# Patient Record
Sex: Female | Born: 1955 | Race: Black or African American | Hispanic: No | Marital: Married | Smoking: Never smoker
Health system: Southern US, Community
[De-identification: ages and names within clinical notes are randomized; demographics above are authoritative.]

## PROBLEM LIST (undated history)

## (undated) DIAGNOSIS — I10 Essential (primary) hypertension: Secondary | ICD-10-CM

## (undated) HISTORY — PX: ABDOMINAL HYSTERECTOMY: SHX81

---

## 2016-07-17 DIAGNOSIS — J111 Influenza due to unidentified influenza virus with other respiratory manifestations: Secondary | ICD-10-CM | POA: Insufficient documentation

## 2016-07-17 DIAGNOSIS — J0100 Acute maxillary sinusitis, unspecified: Secondary | ICD-10-CM | POA: Insufficient documentation

## 2019-08-06 ENCOUNTER — Telehealth: Payer: Self-pay | Admitting: *Deleted

## 2019-08-06 NOTE — Telephone Encounter (Signed)
Patient confirming vaccine appointment.

## 2019-08-09 ENCOUNTER — Ambulatory Visit: Payer: Self-pay | Attending: Internal Medicine

## 2019-08-09 DIAGNOSIS — Z23 Encounter for immunization: Secondary | ICD-10-CM | POA: Insufficient documentation

## 2019-08-09 NOTE — Progress Notes (Signed)
   Covid-19 Vaccination Clinic  Name:  Cristina Quinn    MRN: [id number] DOB: April 08, 1956  08/09/2019  Cristina Quinn was observed post Covid-19 immunization for 15 minutes without incident. She was provided with Vaccine Information Sheet and instruction to access the V-Safe system.   Cristina Quinn was instructed to call 911 with any severe reactions post vaccine: Marland Kitchen Difficulty breathing  . Swelling of face and throat  . A fast heartbeat  . A bad rash all over body  . Dizziness and weakness   Immunizations Administered    Name Date Dose VIS Date Route   Pfizer COVID-19 Vaccine 08/09/2019  1:30 PM 0.3 mL 05/15/2019 Intramuscular   Manufacturer: [employer], [employer]   Lot: [id number]   NDC: [address]-4

## 2019-09-08 ENCOUNTER — Ambulatory Visit: Payer: Self-pay | Attending: Internal Medicine

## 2019-09-08 DIAGNOSIS — Z23 Encounter for immunization: Secondary | ICD-10-CM

## 2019-09-08 NOTE — Progress Notes (Signed)
   Covid-19 Vaccination Clinic  Name:  Cristina Quinn    MRN: [id number] DOB: [birth year]  09/08/2019  Cristina Quinn was observed post Covid-19 immunization for 15 minutes without incident. She was provided with Vaccine Information Sheet and instruction to access the V-Safe system.   Cristina Quinn was instructed to call 911 with any severe reactions post vaccine: Marland Kitchen Difficulty breathing  . Swelling of face and throat  . A fast heartbeat  . A bad rash all over body  . Dizziness and weakness   Immunizations Administered    Name Date Dose VIS Date Route   Pfizer COVID-19 Vaccine 09/08/2019  4:00 PM 0.3 mL 05/15/2019 Intramuscular   Manufacturer: [employer], [employer]   Lot: [id number]   NDC: [address]-5

## 2019-10-26 ENCOUNTER — Emergency Department (HOSPITAL_BASED_OUTPATIENT_CLINIC_OR_DEPARTMENT_OTHER): Payer: PRIVATE HEALTH INSURANCE

## 2019-10-26 ENCOUNTER — Emergency Department (HOSPITAL_BASED_OUTPATIENT_CLINIC_OR_DEPARTMENT_OTHER)
Admission: EM | Admit: 2019-10-26 | Discharge: 2019-10-26 | Disposition: A | Discharge status: Home / Self Care | Payer: PRIVATE HEALTH INSURANCE | Attending: Emergency Medicine | Admitting: Emergency Medicine

## 2019-10-26 ENCOUNTER — Encounter (HOSPITAL_BASED_OUTPATIENT_CLINIC_OR_DEPARTMENT_OTHER): Payer: Self-pay

## 2019-10-26 ENCOUNTER — Other Ambulatory Visit: Payer: Self-pay

## 2019-10-26 DIAGNOSIS — I10 Essential (primary) hypertension: Secondary | ICD-10-CM | POA: Diagnosis not present

## 2019-10-26 DIAGNOSIS — R002 Palpitations: Secondary | ICD-10-CM | POA: Diagnosis not present

## 2019-10-26 DIAGNOSIS — Z79899 Other long term (current) drug therapy: Secondary | ICD-10-CM | POA: Insufficient documentation

## 2019-10-26 HISTORY — DX: Essential (primary) hypertension: I10

## 2019-10-26 LAB — CBC WITH DIFFERENTIAL/PLATELET
Abs Immature Granulocytes: 0.01 10*3/uL (ref 0.00–0.07)
Basophils Absolute: 0 10*3/uL (ref 0.0–0.1)
Basophils Relative: 1 %
Eosinophils Absolute: 0.1 10*3/uL (ref 0.0–0.5)
Eosinophils Relative: 1 %
HCT: 31.6 % — ABNORMAL LOW (ref 36.0–46.0)
Hemoglobin: 9.8 g/dL — ABNORMAL LOW (ref 12.0–15.0)
Immature Granulocytes: 0 %
Lymphocytes Relative: 39 %
Lymphs Abs: 2.7 10*3/uL (ref 0.7–4.0)
MCH: 24 pg — ABNORMAL LOW (ref 26.0–34.0)
MCHC: 31 g/dL (ref 30.0–36.0)
MCV: 77.3 fL — ABNORMAL LOW (ref 80.0–100.0)
Monocytes Absolute: 0.6 10*3/uL (ref 0.1–1.0)
Monocytes Relative: 9 %
Neutro Abs: 3.5 10*3/uL (ref 1.7–7.7)
Neutrophils Relative %: 50 %
Platelets: 294 10*3/uL (ref 150–400)
RBC: 4.09 MIL/uL (ref 3.87–5.11)
RDW: 15.6 % — ABNORMAL HIGH (ref 11.5–15.5)
WBC: 7 10*3/uL (ref 4.0–10.5)
nRBC: 0 % (ref 0.0–0.2)

## 2019-10-26 LAB — COMPREHENSIVE METABOLIC PANEL
ALT: 13 U/L (ref 0–44)
AST: 20 U/L (ref 15–41)
Albumin: 4 g/dL (ref 3.5–5.0)
Alkaline Phosphatase: 30 U/L — ABNORMAL LOW (ref 38–126)
Anion gap: 11 (ref 5–15)
BUN: 14 mg/dL (ref 8–23)
CO2: 25 mmol/L (ref 22–32)
Calcium: 8.5 mg/dL — ABNORMAL LOW (ref 8.9–10.3)
Chloride: 102 mmol/L (ref 98–111)
Creatinine, Ser: 0.8 mg/dL (ref 0.44–1.00)
GFR calc Af Amer: >60 mL/min (ref >60)
GFR calc non Af Amer: >60 mL/min (ref >60)
Glucose, Bld: 109 mg/dL — ABNORMAL HIGH (ref 70–99)
Potassium: 3.9 mmol/L (ref 3.5–5.1)
Sodium: 138 mmol/L (ref 135–145)
Total Bilirubin: 0.8 mg/dL (ref 0.3–1.2)
Total Protein: 7.1 g/dL (ref 6.5–8.1)

## 2019-10-26 MED ORDER — HYDROCHLOROTHIAZIDE 25 MG PO TABS: 25.0000 mg | ORAL_TABLET | Freq: Every day | ORAL | 0 refills | Status: DC

## 2019-10-26 NOTE — Discharge Instructions (Addendum)
I have refilled your prescription for HCTZ.  The pharmacy did not know that you are on a statin.  You will need to discuss with your primary care doctor to get this refilled.  As we discussed, your work-up today was reassuring.  You need to follow-up with your primary care doctor as you may need a Holter monitor at home to monitor these episodes of palpitations.  Return the emergency department for chest pain, difficulty breathing, abdominal pain, vomiting or any other worsening or concerning symptoms.

## 2019-10-26 NOTE — ED Triage Notes (Signed)
Pt states that her heart has been beating fast off and on over the past few months. Pt reports that this has become more frequent with some lightheadedness. Pt reports she has also been out of her BP medication X1 month.

## 2019-10-26 NOTE — ED Provider Notes (Signed)
[facility] EMERGENCY DEPARTMENT Provider Note   CSN: [id number] Arrival date & time: 10/26/19  1635     History Chief Complaint  Patient presents with  . Palpitations    Cristina Quinn is a 64 y.o. female has been history of hypertension who presents for evaluation of palpitations.  Patient reports that this is been an ongoing issue for the last several months.  She states that she had had episodes where it had gone away and she thought that it improved but several months ago, her return.  She states that these episodes occur randomly.  She states that she did notice them when she would get stressed or anxious.  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  She has also had on at rest.  They are not tied to a certain activity.  She does not have any associated chest pain or shortness of breath.  She feels like over the last 2 weeks, they have become more severe.  Today, she felt like it was lasting a bit longer which is what prompted her to come to the emergency department.  She reports that she is up from [place] visiting and taking care of her mother.  She states that she is on a statin and hydrochlorothiazide 25 mg daily for her blood pressure.  She reports she has been out of those for 6 weeks.  She denies any fevers, cough, abdominal pain, nausea/vomiting, chest pain.  The history is provided by the patient.       Past Medical History:  Diagnosis Date  . Hypertension     There are no problems to display for this patient.   Past Surgical History:  Procedure Laterality Date  . ABDOMINAL HYSTERECTOMY       OB History   No obstetric history on file.     No family history on file.  Social History   Tobacco Use  . Smoking status: Never Smoker  . Smokeless tobacco: Never Used  Substance Use Topics  . Alcohol  use: Yes    Comment: social  . Drug use: Never    Home Medications Prior to Admission medications   Medication Sig Start Date End Date Taking? Authorizing Provider  hydrochlorothiazide (HYDRODIURIL) 25 MG tablet Take 1 tablet (25 mg total) by mouth daily. 10/26/19 11/25/19  Volanda Napoleon, PA-C    Allergies    Patient has no known allergies.  Review of Systems   Review of Systems  Constitutional: Negative for fever.  Respiratory: Negative for cough and shortness of breath.   Cardiovascular: Positive for palpitations. Negative for chest pain.  Gastrointestinal: Negative for abdominal pain, nausea and vomiting.  Genitourinary: Negative for dysuria and hematuria.  Neurological: Negative for headaches.  All other systems reviewed and are negative.   Physical Exam Updated Vital Signs BP 137/83   Pulse (!) 54   Temp 98 F (36.7 C) (Oral)   Resp 18   Ht 5\' 4"  (1.626 m)   Wt 89.4 kg   SpO2 96%   BMI 33.81 kg/m   Physical Exam Vitals and nursing note reviewed.  Constitutional:      Appearance: Normal appearance. She is well-developed.  HENT:     Head: Normocephalic and atraumatic.  Eyes:     General: Lids are normal.     Conjunctiva/sclera: Conjunctivae  normal.     Pupils: Pupils are equal, round, and reactive to light.  Cardiovascular:     Rate and Rhythm: Normal rate and regular rhythm.     Pulses: Normal pulses.     Heart sounds: Normal heart sounds. No murmur. No friction rub. No gallop.   Pulmonary:     Effort: Pulmonary effort is normal.     Breath sounds: Normal breath sounds.     Comments: Lungs clear to auscultation bilaterally.  Symmetric chest rise.  No wheezing, rales, rhonchi. Abdominal:     Palpations: Abdomen is soft. Abdomen is not rigid.     Tenderness: There is no abdominal tenderness. There is no guarding.     Comments: Abdomen is soft, non-distended, non-tender. No rigidity, No guarding. No peritoneal signs.  Musculoskeletal:        General:  Normal range of motion.     Cervical back: Full passive range of motion without pain.  Skin:    General: Skin is warm and dry.     Capillary Refill: Capillary refill takes less than 2 seconds.  Neurological:     Mental Status: She is alert and oriented to person, place, and time.  Psychiatric:        Speech: Speech normal.      ED Results / Procedures / Treatments   Labs (all labs ordered are listed, but only abnormal results are displayed) Labs Reviewed  COMPREHENSIVE METABOLIC PANEL - Abnormal; Notable for the following components:      Result Value   Glucose, Bld 109 (*)    Calcium 8.5 (*)    Alkaline Phosphatase 30 (*)    All other components within normal limits  CBC WITH DIFFERENTIAL/PLATELET - Abnormal; Notable for the following components:   Hemoglobin 9.8 (*)    HCT 31.6 (*)    MCV 77.3 (*)    MCH 24.0 (*)    RDW 15.6 (*)    All other components within normal limits    EKG EKG Interpretation  Date/Time:  Monday Oct 26 2019 16:47:38 EDT Ventricular Rate:  56 PR Interval:    QRS Duration: 108 QT Interval:  436 QTC Calculation: 421 R Axis:   60 Text Interpretation: Sinus rhythm Ventricular premature complex No previous tracing Confirmed by Gwyneth Sprout ([id number]) on 10/26/2019 5:31:25 PM   Radiology DG Chest 2 View  Result Date: 10/26/2019 CLINICAL DATA:  Palpitations peer EXAM: CHEST - 2 VIEW COMPARISON:  None. FINDINGS: There is no evidence of acute infiltrate, pleural effusion or pneumothorax. The heart size and mediastinal contours are within normal limits. The visualized skeletal structures are unremarkable. IMPRESSION: No active cardiopulmonary disease. Electronically Signed   By: Aram Candela M.D.   On: 10/26/2019 17:35    Procedures Procedures (including critical care time)  Medications Ordered in ED Medications - No data to display  ED Course  I have reviewed the triage vital signs and the nursing notes.  Pertinent labs & imaging  results that were available during my care of the patient were reviewed by me and considered in my medical decision making (see chart for details).    MDM Rules/Calculators/A&P                      64 year old female who presents for evaluation of palpitations.  She reports this is been an ongoing issue for several months.  Today, felt like it lasted longer.  She states that these episodes occur randomly though she does feel like  it is she has noticed that whenever she gets anxious/worried after taking care of her mom with dementia.  No associated chest pain or difficulty breathing.  She reports she supposed be on a statin and hydrochlorothiazide for her blood pressure but has been off of it due to visiting [place].  On initially arrival, she is afebrile, nontoxic-appearing.  Vital signs are stable.  Her heart rate is maintained between 55-60.  Question if this is anxiety related as she does pinpoint areas of stress where she feels like the palpitations are worse.  Also plan to check labs for evaluation of electrolyte abnormalities.  At this time, no chest pain.  No concern for ACS etiology.  Do not suspect PE.  Plan to check labs, chest x-ray.  CBC shows no leukocytosis.  Hemoglobin is 9.8.  CMP shows normal BUN and creatinine.  Chest x-ray shows no evidence of acute abnormalities.  We will plan to start patient on HCTZ.  At this time, she does not remember what statin she is on.  Patient is scheduled to go back to [place] in July 2021.  I discussed with patient that if she continues to have palpitations, she may need a Holter monitor.  Instructed patient that she will need to follow-up with her primary care doctor. At this time, patient exhibits no emergent life-threatening condition that require further evaluation in ED or admission. Patient had ample opportunity for questions and discussion. All patient's questions were answered with full understanding. Strict return precautions discussed.  Patient expresses understanding and agreement to plan.   Portions of this note were generated with Scientist, clinical (histocompatibility and immunogenetics). Dictation errors may occur despite best attempts at proofreading.  Final Clinical Impression(s) / ED Diagnoses Final diagnoses:  Palpitations    Rx / DC Orders ED Discharge Orders         Ordered    hydrochlorothiazide (HYDRODIURIL) 25 MG tablet  Daily     10/26/19 1826           Rosana Hoes 10/26/19 1936    Gwyneth Sprout, MD 10/27/19 2057

## 2019-10-26 NOTE — ED Notes (Signed)
Pt on monitor 

## 2021-07-02 IMAGING — CR DG CHEST 2V
2 series · 2 of 2 positions shown · non-contrast
Comparison: None.

CLINICAL DATA: Palpitations peer

EXAM:
CHEST - 2 VIEW

[w chest pa]
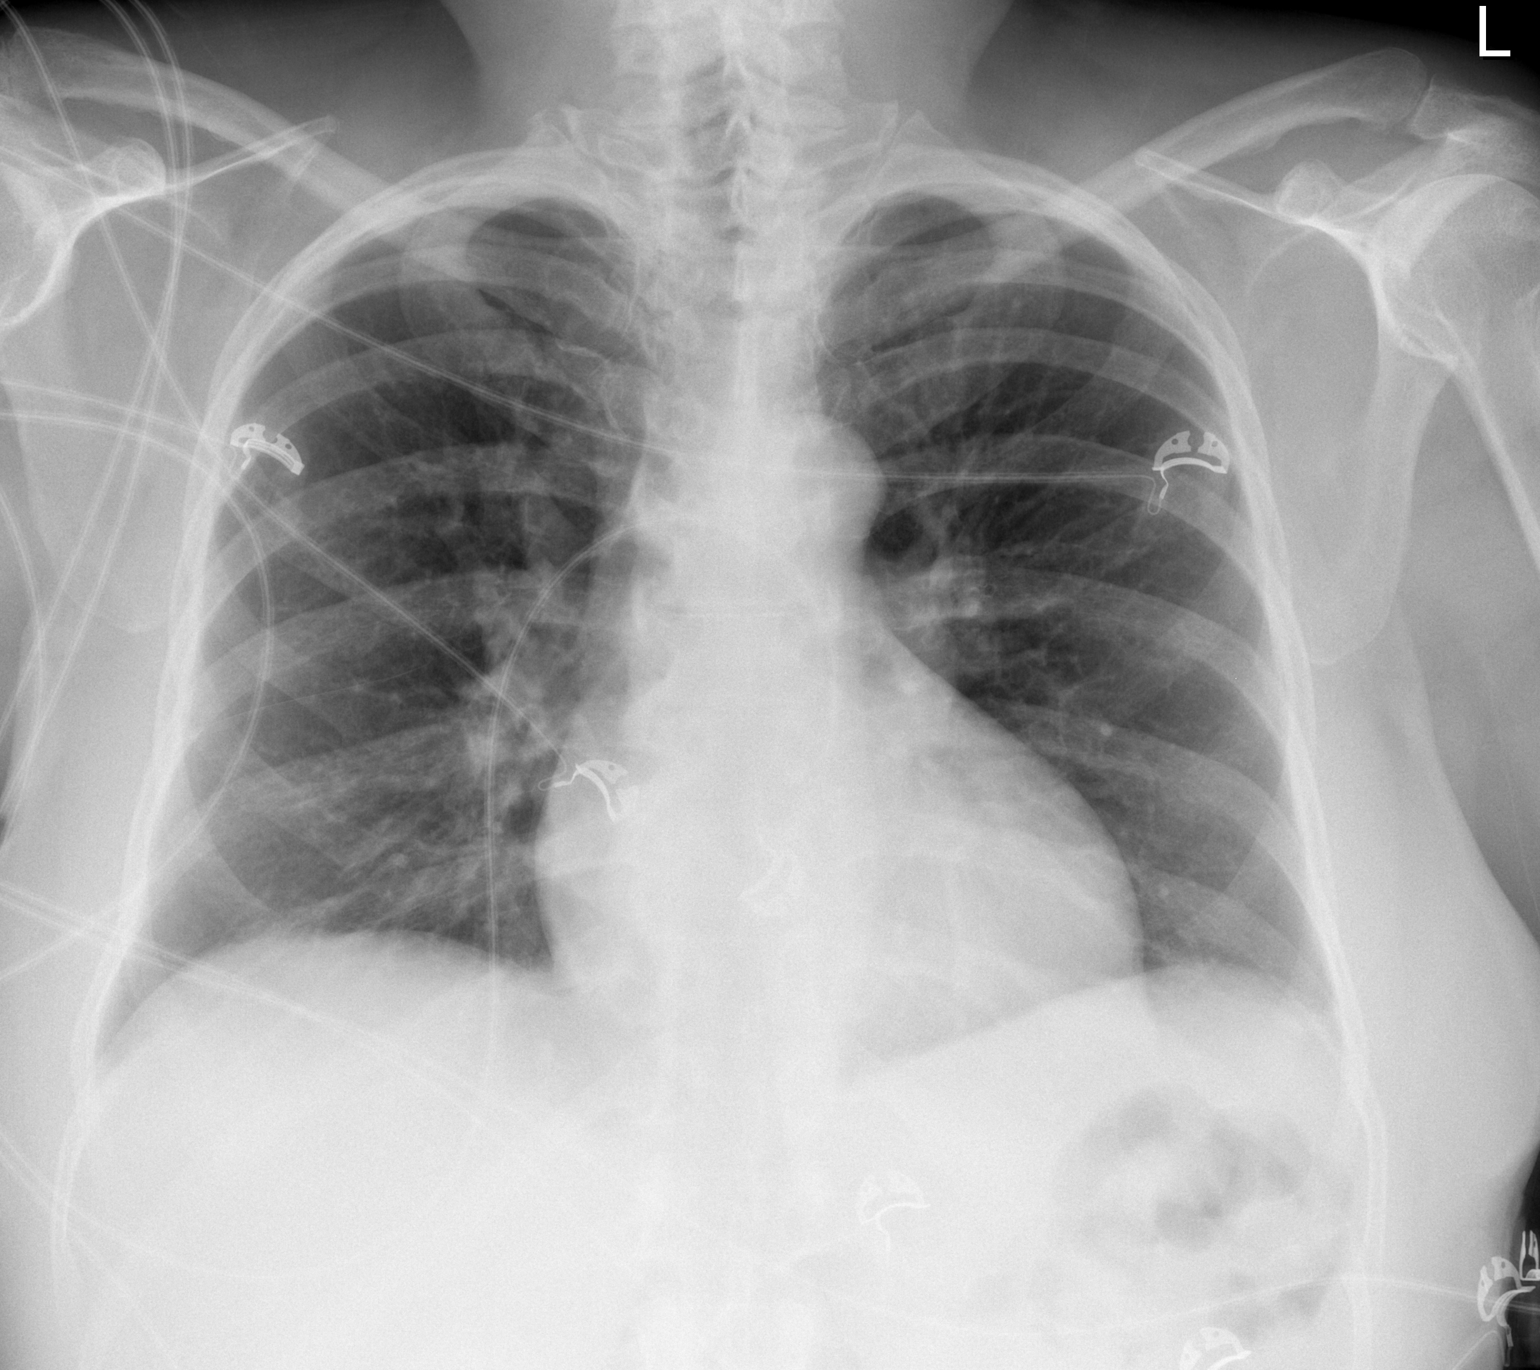

[w chest lat]
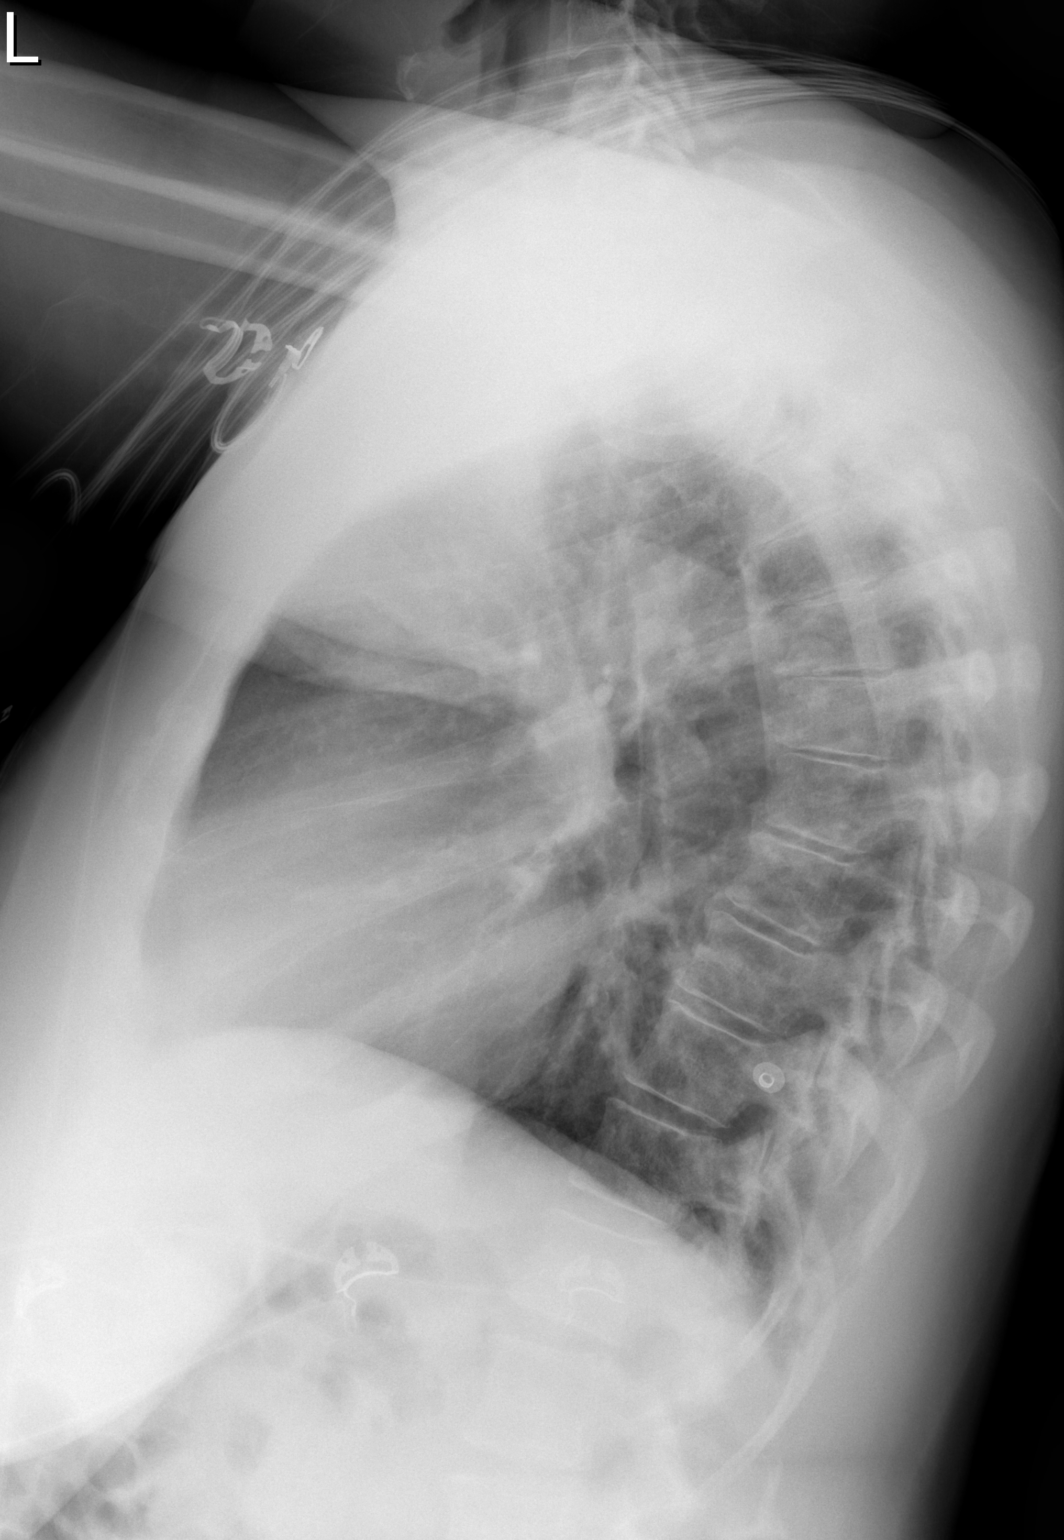

[2 of 2 positions shown; findings below may reference images not displayed]

FINDINGS: There is no evidence of acute infiltrate, pleural effusion or
pneumothorax. The heart size and mediastinal contours are within
normal limits. The visualized skeletal structures are unremarkable.
IMPRESSION: No active cardiopulmonary disease.

## 2021-07-05 DIAGNOSIS — G8929 Other chronic pain: Secondary | ICD-10-CM | POA: Insufficient documentation

## 2021-07-05 DIAGNOSIS — L659 Nonscarring hair loss, unspecified: Secondary | ICD-10-CM | POA: Insufficient documentation

## 2021-07-05 DIAGNOSIS — J302 Other seasonal allergic rhinitis: Secondary | ICD-10-CM | POA: Insufficient documentation

## 2021-07-05 DIAGNOSIS — Z636 Dependent relative needing care at home: Secondary | ICD-10-CM | POA: Insufficient documentation

## 2021-07-05 DIAGNOSIS — R413 Other amnesia: Secondary | ICD-10-CM | POA: Insufficient documentation

## 2021-07-05 DIAGNOSIS — D563 Thalassemia minor: Secondary | ICD-10-CM | POA: Insufficient documentation

## 2021-07-05 DIAGNOSIS — Z8262 Family history of osteoporosis: Secondary | ICD-10-CM | POA: Insufficient documentation

## 2021-07-05 DIAGNOSIS — F419 Anxiety disorder, unspecified: Secondary | ICD-10-CM | POA: Insufficient documentation

## 2021-12-17 DIAGNOSIS — R42 Dizziness and giddiness: Secondary | ICD-10-CM | POA: Insufficient documentation

## 2021-12-17 DIAGNOSIS — R252 Cramp and spasm: Secondary | ICD-10-CM | POA: Insufficient documentation

## 2021-12-17 DIAGNOSIS — Z8639 Personal history of other endocrine, nutritional and metabolic disease: Secondary | ICD-10-CM | POA: Insufficient documentation

## 2022-03-09 ENCOUNTER — Other Ambulatory Visit: Payer: Self-pay | Admitting: Family Medicine

## 2022-03-09 DIAGNOSIS — Z1231 Encounter for screening mammogram for malignant neoplasm of breast: Secondary | ICD-10-CM

## 2022-03-12 ENCOUNTER — Ambulatory Visit
Admission: RE | Admit: 2022-03-12 | Discharge: 2022-03-12 | Disposition: A | Discharge status: Home / Self Care | Payer: Medicare Other | Source: Ambulatory Visit | Attending: Family Medicine | Admitting: Family Medicine

## 2022-03-12 DIAGNOSIS — Z1231 Encounter for screening mammogram for malignant neoplasm of breast: Secondary | ICD-10-CM

## 2022-06-19 DIAGNOSIS — J329 Chronic sinusitis, unspecified: Secondary | ICD-10-CM | POA: Insufficient documentation

## 2023-09-19 DIAGNOSIS — H903 Sensorineural hearing loss, bilateral: Secondary | ICD-10-CM | POA: Insufficient documentation

## 2023-11-19 ENCOUNTER — Other Ambulatory Visit: Payer: Self-pay

## 2023-11-19 ENCOUNTER — Emergency Department (HOSPITAL_BASED_OUTPATIENT_CLINIC_OR_DEPARTMENT_OTHER)

## 2023-11-19 ENCOUNTER — Emergency Department (HOSPITAL_BASED_OUTPATIENT_CLINIC_OR_DEPARTMENT_OTHER): Admission: EM | Admit: 2023-11-19 | Discharge: 2023-11-19 | Disposition: A | Discharge status: Home / Self Care

## 2023-11-19 DIAGNOSIS — I10 Essential (primary) hypertension: Secondary | ICD-10-CM | POA: Insufficient documentation

## 2023-11-19 DIAGNOSIS — Z79899 Other long term (current) drug therapy: Secondary | ICD-10-CM | POA: Insufficient documentation

## 2023-11-19 DIAGNOSIS — D649 Anemia, unspecified: Secondary | ICD-10-CM | POA: Diagnosis not present

## 2023-11-19 DIAGNOSIS — R42 Dizziness and giddiness: Secondary | ICD-10-CM

## 2023-11-19 LAB — CBC WITH DIFFERENTIAL/PLATELET
Abs Immature Granulocytes: 0.02 10*3/uL (ref 0.00–0.07)
Basophils Absolute: 0 10*3/uL (ref 0.0–0.1)
Basophils Relative: 0 %
Eosinophils Absolute: 0.1 10*3/uL (ref 0.0–0.5)
Eosinophils Relative: 1 %
HCT: 32.8 % — ABNORMAL LOW (ref 36.0–46.0)
Hemoglobin: 10.3 g/dL — ABNORMAL LOW (ref 12.0–15.0)
Immature Granulocytes: 0 %
Lymphocytes Relative: 34 %
Lymphs Abs: 2.3 10*3/uL (ref 0.7–4.0)
MCH: 23.7 pg — ABNORMAL LOW (ref 26.0–34.0)
MCHC: 31.4 g/dL (ref 30.0–36.0)
MCV: 75.6 fL — ABNORMAL LOW (ref 80.0–100.0)
Monocytes Absolute: 0.6 10*3/uL (ref 0.1–1.0)
Monocytes Relative: 8 %
Neutro Abs: 3.8 10*3/uL (ref 1.7–7.7)
Neutrophils Relative %: 57 %
Platelets: 304 10*3/uL (ref 150–400)
RBC: 4.34 MIL/uL (ref 3.87–5.11)
RDW: 15.1 % (ref 11.5–15.5)
WBC: 6.7 10*3/uL (ref 4.0–10.5)
nRBC: 0 % (ref 0.0–0.2)

## 2023-11-19 LAB — COMPREHENSIVE METABOLIC PANEL WITH GFR
ALT: 14 U/L (ref 0–44)
AST: 20 U/L (ref 15–41)
Albumin: 4.3 g/dL (ref 3.5–5.0)
Alkaline Phosphatase: 39 U/L (ref 38–126)
Anion gap: 13 (ref 5–15)
BUN: 16 mg/dL (ref 8–23)
CO2: 24 mmol/L (ref 22–32)
Calcium: 9.3 mg/dL (ref 8.9–10.3)
Chloride: 107 mmol/L (ref 98–111)
Creatinine, Ser: 0.84 mg/dL (ref 0.44–1.00)
GFR, Estimated: >60 mL/min (ref >60)
Glucose, Bld: 124 mg/dL — ABNORMAL HIGH (ref 70–99)
Potassium: 3.6 mmol/L (ref 3.5–5.1)
Sodium: 143 mmol/L (ref 135–145)
Total Bilirubin: 0.6 mg/dL (ref 0.0–1.2)
Total Protein: 7.2 g/dL (ref 6.5–8.1)

## 2023-11-19 LAB — TROPONIN T, HIGH SENSITIVITY: Troponin T High Sensitivity: <15 ng/L (ref <19)

## 2023-11-19 LAB — TSH: TSH: 1.211 u[IU]/mL (ref 0.350–4.500)

## 2023-11-19 NOTE — ED Triage Notes (Signed)
 Pt states that she has had some ringing in her ears, headache and episodes of teeth pain. States that she has seen the ENT recently and a cardiologist. States that she has also had some pressure in her nose. Denies SOB, nausea and vomiting. States that the headaches come and go. States that she has some weakness everyday and some dizziness.

## 2023-11-19 NOTE — ED Provider Notes (Addendum)
 [facility] Provider Note   CSN: [id number] Arrival date & time: 11/19/23  1440     Patient presents with: Tinnitus   Cristina Quinn is a 68 y.o. female with history of hypertension, hypercholesterolemia, presents with concern for lightheadedness that has been ongoing for the past 6 weeks.  States she normally feels fine in the morning, but the lightheaded sensation will start to develop later in the day.  This is usually associated with activity or standing up.  She denies any double vision, room spinning, chest pain, shortness of breath during these episodes.  She is never passed out during these episodes.  She also reports ear ringing in both ears ongoing for the past 3 months which she has seen ENT for.  No ear drainage or pain in her ears.  No new change to her tinnitus.  Also reports intermittent headaches over the past couple weeks that feeling a pressure in sensation behind her nose.  She states she has had headaches previously, but they usually do not involve a pressure sensation behind the nose.  No headache currently. No fevers, chills, neck pain.   HPI     Prior to Admission medications   Medication Sig Start Date End Date Taking? Authorizing Provider  hydrochlorothiazide  (HYDRODIURIL ) 25 MG tablet Take 1 tablet (25 mg total) by mouth daily. 10/26/19 11/25/19  Layden, Lindsey A, PA-C    Allergies: Patient has no known allergies.    Review of Systems  Neurological:  Positive for light-headedness and headaches.    Updated Vital Signs BP 133/74 (BP Location: Left Arm)   Pulse 68   Temp 97.6 F (36.4 C)   Resp 20   Ht 5' 4 (1.626 m)   Wt 86.2 kg   SpO2 98%   BMI 32.61 kg/m   Physical Exam Vitals and nursing note reviewed.  Constitutional:      General: She is not in acute distress.    Appearance: She is well-developed.  HENT:     Head: Normocephalic and atraumatic.     Right Ear: Tympanic membrane and ear canal  normal.     Left Ear: Tympanic membrane and ear canal normal.     Ears:     Comments: No drainage    Nose: No congestion.   Eyes:     Extraocular Movements: Extraocular movements intact.     Conjunctiva/sclera: Conjunctivae normal.     Pupils: Pupils are equal, round, and reactive to light.    Cardiovascular:     Rate and Rhythm: Normal rate and regular rhythm.     Heart sounds: No murmur heard. Pulmonary:     Effort: Pulmonary effort is normal. No respiratory distress.     Breath sounds: Normal breath sounds.  Abdominal:     Palpations: Abdomen is soft.     Tenderness: There is no abdominal tenderness.   Musculoskeletal:        General: No swelling.     Cervical back: Normal range of motion and neck supple. No rigidity.   Skin:    General: Skin is warm and dry.     Capillary Refill: Capillary refill takes less than 2 seconds.   Neurological:     General: No focal deficit present.     Mental Status: She is alert and oriented to person, place, and time.   Psychiatric:        Mood and Affect: Mood normal.     (all labs ordered are listed,  but only abnormal results are displayed) Labs Reviewed  CBC WITH DIFFERENTIAL/PLATELET - Abnormal; Notable for the following components:      Result Value   Hemoglobin 10.3 (*)    HCT 32.8 (*)    MCV 75.6 (*)    MCH 23.7 (*)    All other components within normal limits  COMPREHENSIVE METABOLIC PANEL WITH GFR - Abnormal; Notable for the following components:   Glucose, Bld 124 (*)    All other components within normal limits  TSH  TROPONIN T, HIGH SENSITIVITY    EKG: None  Radiology: CT Head Wo Contrast Result Date: 11/19/2023 CLINICAL DATA:  Headache EXAM: CT HEAD WITHOUT CONTRAST TECHNIQUE: Contiguous axial images were obtained from the base of the skull through the vertex without intravenous contrast. RADIATION DOSE REDUCTION: This exam was performed according to the departmental dose-optimization program which includes  automated exposure control, adjustment of the mA and/or kV according to patient size and/or use of iterative reconstruction technique. COMPARISON:  None Available. FINDINGS: Brain: No acute territorial infarction, hemorrhage or intracranial mass. The ventricles are nonenlarged. Vascular: No hyperdense vessel or unexpected calcification. Skull: Normal. Negative for fracture or focal lesion. Sinuses/Orbits: No acute finding. Other: None IMPRESSION: Negative non contrasted CT appearance of the brain. Electronically Signed   By: Esmeralda Hedge M.D.   On: 11/19/2023 16:20     Procedures   Medications Ordered in the ED - No data to display                                  Medical Decision Making Amount and/or Complexity of Data Reviewed Labs: ordered. Radiology: ordered.     Differential diagnosis includes but is not limited to tinnitus, eustachian tube dysfunction, otitis media, otitis externa, arrhythmia, ACS, electrolyte abnormality, dehydration, thyroid abnormality, intracranial mass, sinusitis, vertigo, Mnire's  ED Course:  Upon initial evaluation, patient is well-appearing, no acute distress.  Stable vitals.  She is reporting no current symptoms such as her lightheadedness or headaches.  She is still having her ear ringing in both ears.  It appears she is seeing cardiology for her lightheadedness on 10/01/2023. Reviewed echo performed at that visit which did show a moderately dilated right ventricle with reduced right ventricular systolic function.  Otherwise normal echo.  She was placed on Holter monitor at that time which did show baseline rhythm of normal sinus rhythm but frequent episodes of SVT noted.  Rare PACs and PVCs were also noted. Patient states she was started on an diltiazem to help with the abnormal rhythm.  ENT note reviewed from 09/04/23 where she was seen for tinnitus.  Patient was recommended to continue with Flonase  Labs Ordered: I Ordered, and personally interpreted  labs.  The pertinent results include:   CBC with hemoglobin of 10.3, does appear to be at baseline.  No leukocytosis CMP without any electrolyte abnormalities.  No elevation of LFTs or creatinine  Imaging Studies ordered: I ordered imaging studies including CT head    I independently visualized the imaging with scope of interpretation limited to determining acute life threatening conditions related to emergency care. Imaging showed No acute abnormalities I agree with the radiologist interpretation   Cardiac Monitoring: / EKG: The patient was maintained on a cardiac monitor.  I personally viewed and interpreted the cardiac monitored which showed an underlying rhythm of: Normal sinus rhythm but will intermittently spike to sinus tachycardia with rates around 130  for 2-3 seconds at a time.   Medications Given: None  Upon re-evaluation, patient still well-appearing with stable vitals.  Discussed that I reviewed her cardiology notes which did reveal episodic SVT on her cardiac monitor.  She states she was prescribed diltiazem for this by her cardiology provider, but has not started on this medication.  I did encourage her to start this as prescribed as this could be causing her lightheadedness.  Workup otherwise is unremarkable today.  Normal electrolytes.  She does have a anemia with hemoglobin 10.3, this is at baseline.  Her headaches were evaluated for with head CT.  No acute abnormalities to explain her change in headache quality. No neck pain or stiffness, no fevers, no concern for meningitis. Suspect these are migraine/tension headaches.  Discussed that if they are not being controlled with Tylenol and ibuprofen at home, are becoming more frequent, she will need to follow-up with her PCP regarding further management. TM and ear canal bilaterally normal appearing, no pain, no change in tinnitus.  No concern for infection at this time.  No ear trauma.  Will have her follow-up with ENT regarding  further management    Impression: Lightheadedness, possibly due to SVT noted on holter monitor  Tension headaches Tinnitus  Disposition:  The patient was discharged home with instructions to take diltiazem as prescribed by her cardiology provider.  Follow-up with her cardiologist within the next month.  Follow-up with PCP as needed for headaches.  Follow-up with ENT as needed for tinnitus Return precautions given.     This chart was dictated using voice recognition software, Dragon. Despite the best efforts of this provider to proofread and correct errors, errors may still occur which can change documentation meaning.       Final diagnoses:  Lightheadedness  Low hemoglobin    ED Discharge Orders     None          Rexie Catena, PA-C 11/19/23 1707    Rexie Catena, PA-C 11/19/23 1708    Rolinda Climes, DO 11/19/23 2002

## 2023-11-19 NOTE — Discharge Instructions (Addendum)
 Your episodes of lightheadedness are likely due to the episodes of fast heart rate (SVT) that your cardiologist found on your monitor. Please start your Diltiazem according to their directions (this will help with your heart rate and should help reduce dizzy episodes) and follow up with your cardiologist within the next month for follow up.   You had a low hemoglobin on your blood counts, but this appears to be at baseline. This can also cause you to feel more fatigued than normal. Please have this rechecked by your PCP within the next month.  Your electrolytes, kidney, and liver labs were normal today.  Your head CT did not show any abnormalities to explain your headache. No signs of a sinus infection.   You may take up to 1000mg  of tylenol every 6 hours as needed for pain. Do not take more then 4g per day.   You may use up to 600mg  ibuprofen every 6 hours as needed for pain.  Do not exceed 2.4g of ibuprofen per day.  Please follow up with your PCP if your headaches are becoming more frequent or not controlled with home medications.  Return to the ER for any chest pain, persistent palpitations, shortness of breath, episodes of passing out, any other new or concerning symptoms

## 2024-01-30 NOTE — Progress Notes (Signed)
 Plan of care-  By stress lab due to arrhythmia.  Patient had multiple short runs of PSVT [vs very fast afib] at very high heart rates approaching 250+ beats per minute in recovery.  By the time I got to her room she was back in sinus rhythm.  She tells me she went back onto the diltiazem and feels it is working better than the metoprolol.  Her resting heart rate is fine of asked her to add the metoprolol to the diltiazem.  I discussed the concept of ablation which I think would be reasonable to discuss with EP given how fast her tachycardia arrhythmia was.  I am placing orders for EP referral.

## 2024-02-14 DIAGNOSIS — R9439 Abnormal result of other cardiovascular function study: Secondary | ICD-10-CM | POA: Insufficient documentation

## 2024-02-14 NOTE — H&P (View-Only) (Signed)
 Patient name: Cristina Quinn  Date of birth: 07-Dec-1955 Date of visit: 02/14/2024 Referring Provider: No ref. provider found   Primary Care Provider: Arnaz Siganporia, FNP                                                Chief Complaint:  Chief Complaint  Patient presents with  . Palpitations  . Hypertension    2 mnth f/u     Impression /Plan:    Assessment & Plan 1. Cardiac arrhythmia: - Stress test results indicate a positive outcome for ischemia to basal LV inferior and inferior lateral wall. The test also revealed an area of hypokinesis in the left ventricle, which is not functioning optimally. - Previous Holter monitor results showed episodes of supraventricular tachycardia (SVT) and 9 runs of ventricular tachycardia. Patient scheduled with Dr. Catheline with EP - A heart catheterization will be scheduled to investigate potential blockages. - Continue metoprolol regimen, which can be taken either once in the morning and once at night, depending on comfort. - Continue Cardizem medication. - Information about the heart catheterization procedure was provided, and it will be performed on an outpatient basis. - Risks of cardiac catheterization including bleeding, infection, and stroke were discussed. - Lab work will be ordered to check kidney function and CBC to ensure no infection or anemia before the heart catheterization.  2. 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 - A CBC will be ordered to check hemoglobin levels before the heart catheterization.  3.  Hyperlipidemia  Continue atorvastatin.  No change in treatment  4.  Hypertension - Stable.  Continue current management.  Follow-up with PCP for further management      History Present Illness:   History of Present Illness The patient is a female who presents for a follow-up after her stress test. she denies chest pain shortness of breath or diaphoresis.  She admits to palpitations.  She denies syncope.  She has a  past medical history significant for hypertension and hyperlipidemia.  She continues to experience palpitations and a racing heart. Currently, she is taking diltiazem at night and again in the late afternoon when symptoms intensify. She has discontinued metoprolol due to concerns about potential side effects, including weight gain.  I have asked her to restart her metoprolol for cardioprotection in light of her abnormal stress test.Blood pressure readings have been stable, typically around 109 systolic, with a reading of 112 today. She recalls feeling fatigued and stressed on the day of her stress test due to a long walk from the parking lot to the clinic.   The patient has reviewed the results of her Holter monitor, which indicated consistent palpitations and episodes of supraventricular tachycardia (SVT) and ventricular tachycardia. She has an upcoming appointment with an electrophysiologist on 02/24/2024. She reports no leg swelling and is not diabetic. She is not aware of any allergies to contrast dye. She is not currently taking prednisone.  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 Additionally, she has irritable bowel syndrome.     Past Medical History:  Medical History[1]   Social History:   Social History   Socioeconomic History  . Marital status: Married    Spouse name: Not on file  . Number of children: Not on file  . Years of education: Not on file  . Highest education level: Not on file  Occupational History  . Not on file  Tobacco Use  . Smoking status: Never    Passive exposure: Never  . Smokeless tobacco: Never  Vaping Use  . Vaping status: Never Used  Substance and Sexual Activity  . Alcohol use: Yes    Comment: occ  . Drug use: Never  . Sexual activity: Yes  Other Topics Concern  . Not on file  Social History Narrative  . Not on file   Social Drivers of Health   Food Insecurity: Low Risk  (01/01/2024)   Food vital sign   . Within the past 12  months, you worried that your food would run out before you got money to buy more: Never true   . Within the past 12 months, the food you bought just didn't last and you didn't have money to get more: Never true  Transportation Needs: No Transportation Needs (10/07/2023)   Transportation   . In the past 12 months, has lack of reliable transportation kept you from medical appointments, meetings, work or from getting things needed for daily living? : No  Safety: High Risk (01/01/2024)   Safety   . How often does anyone, including family and friends, physically hurt you?: Never   . How often does anyone, including family and friends, insult or talk down to you?: Rarely   . How often does anyone, including family and friends, threaten you with harm?: Never   . How often does anyone, including family and friends, scream or curse at you?: Never  Living Situation: Low Risk  (01/01/2024)   Living Situation   . What is your living situation today?: I have a steady place to live   . Think about the place you live. Do you have problems with any of the following? Choose all that apply:: None/None on this list    Family History :  Family History[2]   Current Medications: Current Medications[3]  Allergies:   Allergies[4]   Review Of  Systems:  Negative except palpitations History obtained from chart review and the patient General ROS: negative Hematological and Lymphatic ROS: negative Endocrine ROS: negative Respiratory ROS: no cough, shortness of breath, or wheezing Cardiovascular ROS: no chest pain or dyspnea on exertion Gastrointestinal ROS: no abdominal pain, change in bowel habits, or black or bloody stools Musculoskeletal ROS: negative  Vital Signs:  BP 112/70 (BP Location: Left arm, Patient Position: Sitting)   Pulse 52   Ht 1.638 m (5' 4.5)   Wt 87.3 kg (192 lb 8 oz)   SpO2 95%   BMI 32.53 kg/m  Wt Readings from Last 3 Encounters:  02/14/24 87.3 kg (192 lb 8 oz)  01/01/24 89.5  kg (197 lb 4.8 oz)  10/07/23 90 kg (198 lb 8 oz)    Physical Examination Constitutional: See Vital Signs                          No Acute Distress Normal Body Habitus and development  Eyes:  Normal conjunctivae Ears Nose and Throat: Normal  Neck: No significant jugular venous distension.  Thyroid  does not appear enlarged visually Respiratory:   No increased work of breathing; CTBA; No wheezes rhonchi or rales  Cardiovascular Exam:   No palpable thrills or lifts, PMI non displaced.  S1 S2   regular rate and  rhythm   No murmurs;  No rubs or gallop.   Normal Carotid upstroke, No Carotid Bruits  No abnormal abdominal pulsation or bruits  Pedal/radial Pulses - present  No significant edema or varicosities   Abdomen  Soft non tender;  non distended, + bowel sounds, No obvious HSM or masses Ext:  No clubbing or cyanosis  MSK:  Generally normal strength;   Neuro:  Alert and Oriented x 2  Grossly normal non focal, Normal mood and affect Psych:  Normal Mood and affect Skin: No rash   I personally reviewed the results of the patient's stress test and echocardiograms.  I have also reviewed her Holter monitor with her.  I reviewed her most recent labs.  All questions were answered.   Diagnostic Studies:  Lab Results  Component Value Date   WBC 7.40 05/21/2023   HGB 11.4 (L) 05/21/2023   HCT 35.6 (L) 05/21/2023   PLT 352 05/21/2023    Lab Results  Component Value Date   NA 139 09/03/2023   K 4.4 09/03/2023   CL 104 09/03/2023   CO2 27 09/03/2023   BUN 17 09/03/2023   CREATININE 0.77 09/03/2023   GLUCOSE 98 09/03/2023   CALCIUM 9.2 09/03/2023   MG 2.0 01/01/2024    Lab Results  Component Value Date   BILITOT 0.9 05/21/2023   PROT 7.5 05/21/2023   ALBUMIN 4.4 05/21/2023   ALT 13 05/21/2023   AST 19 05/21/2023   ALP 41 05/21/2023    No results found for: LABPROT, INR, PTT  Lab Results  Component Value Date   CHOL 223 (H) 05/21/2023   CHOL 272 (H) 07/03/2022    CHOL 270 (H) 12/12/2021   Lab Results  Component Value Date   HDL 87 05/21/2023   HDL 91 07/03/2022   HDL 91 12/12/2021   Lab Results  Component Value Date   LDLCALC 115 (H) 05/21/2023   Lab Results  Component Value Date   TRIG 103 05/21/2023   TRIG 91 07/03/2022   TRIG 77 12/12/2021   Lab Results  Component Value Date   HDL 87 05/21/2023   HDL 91 07/03/2022   HDL 91 12/12/2021      Thank you for allowing me to participate in the care of your patient.  Please feel free to contact me ifyou need any further information.  Samandra Nadine Tann, NP       [1] Past Medical History: Diagnosis Date  . Anemia   . Breast cancer screening by mammogram 12/17/2021  . Colon cancer screening 07/05/2021  . Encounter for Medicare annual wellness exam 07/05/2021  . Encounter for screening mammogram for malignant neoplasm of breast 07/05/2021  . Encounter to establish care 07/05/2021  . Hyperlipidemia   . Hyperlipidemia   . Hypertension   . Screening for colon cancer 12/17/2021  . Thalassemia minor   [2] Family History Problem Relation Name Age of Onset  . Alzheimer's disease Mother    . Osteoarthritis Mother    . Heart disease Mother    . Lung cancer Father    . Heart failure Maternal Aunt    . Lung cancer Paternal Uncle    . Lung cancer Paternal Uncle    . Stroke Maternal Grandmother    . Alzheimer's disease Paternal Grandmother    . Heart attack Cousin         November 2024  . Breast cancer Neg Hx    [3] Current Outpatient Medications  Medication Sig Dispense Refill  . atorvastatin (LIPITOR) 20 mg tablet Take 20 mg by mouth Once Daily. Indications: high cholesterol and high triglycerides 90 tablet 1  . calcium  citrate-vitamin D3 315 mg-5 mcg (200 unit) tab Take 1 tablet by mouth 2 (two) times a day.    . cholecalciferol (VITAMIN D3) 10 mcg/mL (400 unit/mL) drop Take by mouth.    . dilTIAZem (CARDIZEM CD) 180 mg 24 hr capsule Take 1 capsule (180 mg total) by mouth daily. 90  capsule 3  . hydroCHLOROthiazide  (HYDRODIURIL ) 25 mg tablet Take 1 tablet (25 mg total) by mouth daily. 90 tablet 1  . loratadine (CLARITIN) 10 mg tablet Take 1 tablet (10 mg total) by mouth daily. 30 tablet 3  . multivitamin (THERAGRAN) tab tablet Take 1 tablet by mouth daily.    SABRA omega 3-dha-epa-fish oil (OMEGA 3) 1,000 mg capsule Take 1 g by mouth daily.    SABRA ascorbic acid (VITAMIN C) 1,000 mg tablet Take 1,000 mg by mouth daily. (Patient not taking: Reported on 02/14/2024)    . Bifidobacterium infantis (ALIGN JR ORAL) Take by mouth daily. (Patient not taking: Reported on 02/14/2024)    . cetirizine (ZyrTEC) 10 mg tablet Take 10 mg by mouth as needed for allergies. (Patient not taking: Reported on 02/14/2024)    . cyanocobalamin (VITAMIN B12) 100 mcg tablet Take 100 mcg by mouth daily. (Patient not taking: Reported on 02/14/2024)    . DOCOSAHEXAENOIC ACID ORAL Take 1 g by mouth. (Patient not taking: Reported on 02/14/2024)    . fluticasone propionate (FLONASE) 50 mcg/spray nasal spray 1 spray Once Daily. (Patient not taking: Reported on 02/14/2024) 9.9 mL 0  . LORazepam (ATIVAN) 0.5 mg tablet TAKE 1 TABLET(0.5 MG) BY MOUTH EVERY NIGHT AS NEEDED FOR ANXIETY (Patient not taking: Reported on 02/14/2024) 30 tablet 0  . magnesium 84 mg TbER Take 84 mg by mouth Once Daily. (Patient not taking: Reported on 02/14/2024)    . metoprolol succinate (TOPROL XL) 25 mg 24 hr tablet Take 1 tablet (25 mg total) by mouth daily. (Patient not taking: Reported on 02/14/2024) 90 tablet 3  . olopatadine (PATANOL) 0.1 % ophthalmic solution Administer 1 drop into each eyes 2 (two) times a day. (Patient not taking: Reported on 02/14/2024) 5 mL 0  . oxymetazoline (AFRIN) 0.05 % nasal spray 2 sprays 2 (two) times a day. (Patient not taking: Reported on 02/14/2024) 30 mL 2  . predniSONE (STERAPRED DS) 10 mg DsPk dose pack Use As Directed On Package. (Patient not taking: Reported on 02/14/2024) 21 tablet 0  . zinc gluconate 50 mg tab  tablet Take 50 mg by mouth Once Daily. (Patient not taking: Reported on 02/14/2024)     No current facility-administered medications for this visit.  [4] No Known Allergies

## 2024-02-20 NOTE — Anesthesia Preprocedure Evaluation (Signed)
  Pre Cath Assessment Form  Patient Name: Tawonna Esquer  Patient DOB: [id number]  Patient MRN:  [id number]  Procedure:   Coronary angiography, Left heart cath  CAD Presentation:   Presentation: Dyspnea/CHF  Prior CABG:   No  CCS Angina Classification:   CCS I  Heart Failure Class:   NYHA Class I-II (Minimal Heart Failure Symptoms with Everyday Activities)  Frailty Class:   Class 3 - Managing well. People who have well-controlled medical problems, but are not active beyond regular walking.  Anti-Anginal Medications within last 2 weeks:   Beta blocker  and Calcium Channel Blocker   Stress or Imaging Study within 6 months  Abnormal, High Risk Result.  Mallampati Class:  II (hard and soft palate, upper portion of tonsils anduvula visible)   ASA Class:    ASA 2 - Patient with mild systemic disease with no functional limitations  Prior Anesthesia  Prior general anesthesia without complications  2012 ACC/AHA Appropriate Use Criteria for Diagnostic Cath Indication Number   [email or web address]    Jeffrey P. Leda, MD, [facility], [facility] Interventional Cardiology and Critical Care Cardiology  02/20/2024, 10:30 AM

## 2024-02-20 NOTE — Interval H&P Note (Signed)
 H&P reviewed. The patient was examined and there are no changes to the H&P.

## 2024-02-24 NOTE — Progress Notes (Signed)
 Name: Cristina Quinn DOB: May 17, 1956   Age:  68 y.o. MRN: [id number]  Referring Physician:   Arne Feast   Reason for Referral:  Palpitation and AF  Chief Complaint: New Patient   HPI:  Cristina Quinn is a 68 y.o. female with past medical history significant for HTN, palpitation, who is referred to the Cardiac Arrhythmia Clinic at [facility] today for initial consultation for new onset AF.    History of Present Illness The patient is a retired female who presents for evaluation of palpitations.  She has been experiencing palpitations for over a year, initially attributing them to anxiety due to the stress of caring for her mother. Even after her mother's passing, the palpitations persisted and were accompanied by dizziness. A recent catheterization revealed no blockages. She is currently on diltiazem and metoprolol, which have improved her palpitations, but she remains concerned about her heart rate. Her symptoms are intermittent throughout the day, but she has not experienced any today. She accidentally took her blood pressure medication twice yesterday and refrained from taking her heart medication last night. She reports that her blood pressure is usually below 112, but it was 132 today, which she attributes to the stress of getting to the appointment. She has had episodes of rapid heartbeat during stress tests. Her dizziness has decreased, but she reports a low heart rate of 49 today. She plans to join [employer] this week as she feels she is not getting enough exercise. She has been dealing with grief over the past year, which she believes may be contributing to her symptoms. She has had near-fainting episodes in the past, but none recently. She also reports seeing flashes of light followed by near blackouts. No syncope  She is retired and worked in the travel industry until 2005. She reports no leg swelling. She does not have diabetes. Her husband  reports that she snores at night. She was taking baby aspirin but stopped after her last procedure. She had symptoms 2 to 3 days ago that lasted a few minutes, but some days she has no symptoms. The longest duration of her symptoms has been 30 minutes. She feels her symptoms are better than before and believes her stress levels have improved. She wakes up around 4 AM and wonders if this could be a trigger for her symptoms. She has been under significant stress following the sudden death of her cousin two months after her mother's passing. She occasionally drinks alcohol and has reduced her sugar intake. She has not had wine or alcohol for a few months.  PAST SURGICAL HISTORY: Left heart catheterization  SOCIAL HISTORY Occupations: Retired, previously worked in the travel industry until 2005. Exercise: Reports not getting enough exercise and plans to join [employer]. Alcohol: Occasionally drinks alcohol, has reduced consumption and has not had any in the past few months.  FAMILY HISTORY - Mother: Dementia, osteoarthritis, bedridden for 6 years  Negative for diabetes in family history.     Denies chest pain, dyspnea, orthopnea, PND, LE edema, or syncope.  Past Medical History:  Medical History[1]   Surgical History:  Surgical History[2]   Social History:  Patient  reports that she has never smoked. She has never been exposed to tobacco smoke. She has never used smokeless tobacco. She reports current alcohol use. She reports that she does not use drugs.   Family History:  Family History[3]   Allergies:  Patient has no known allergies.   Encounter Medications[4]  Review of Systems:  CONSTITUTIONAL: Patient has had no recent weight loss, no weight gain, no fever, no chills and no night sweats.  HEENT: No nasal discharge, no allergies, no epistaxis. Clear sclerae.   PULMONARY: No shortness of breath, no cough, no sputum production, and no hemoptysis.   CARDIAC: See the  present illness.  GI: No diarrhea, constipation, melena or hematochezia. No abdominal pain. GU: No dysuria, no hematuria, no increased frequency or hesitancy. NEUROLOGIC: No stroke or TIA symptoms. MS: Patient complains of minor arthritis/musculoskeletal pains. ENDOCRINE: No thyroid , adrenal, or pituitary problems. SKIN: No rash, no itching, no pigmentation changes, no excessive moisture or dryness.  PSYCHIATRIC: No recent depressive symptoms. LYMPHATIC: No lymph node swelling.   Physical Examination:  GENERAL: Patient is well nourished, well developed, and in no apparent distress. VITAL SIGNS:  Vitals:   02/24/24 1528  BP: 132/67  Pulse: 53  SpO2: 100%   HEENT: Negative for arcus senilis or xanthelasma. The pupils are equal and react to light and accommodation. The extraocular motions are intact. The sclerae are clear. The mucous membranes are pink and moist.  NECK: Patient has normal carotid upstrokes without bruits. No neck vein distention or palpable thyroid  enlargement.  CHEST: There are no chest wall deformities.  There is no chest wall tenderness. Respirations are unlabored. Lungs are clear to auscultation and percussion.  CARDIAC: The PMI is not displaced. Regular rate and rhythm without significant murmur, rub, or gallop.The first and second heart sounds are normal. Pulses are 2+ and symmetrical. Normal jugular venous pressure ABDOMEN: Soft, non-tender, non-distended. There are no masses or organomegaly. There are normal bowel sounds. EXTREMITIES: No cyanosis, clubbing or edema.  NEUROLOGIC: Patient is oriented x3 with no focal or lateralizing neurologic deficits. Patients affect is appropriate, there is no evidence of anxiety or depression. SKIN:  Warm and dry; no lesions   Diagnostics: Today's ECG, personally and independently reviewed by me, showed sinus bradycardia @ 49 bpm   Transthoracic echo:  10/01/2023 PROCEDURE  Image Quality  Fair. A two-dimensional  transthoracic echocardiogram with  color flow and Doppler was  performed.  Normal LV size, wall thickness, wall motion and systolic function with  ejection fraction 60-65%  There is no significant valvular stenosis or regurgitation.  There is no pericardial effusion.  IVC size was normal.  The ascending aorta is normal size.  There is no comparison study available.   Holter: 11/08/2023 FINAL PROVIDER INTERPRETATION  Baseline rhythm is normal sinus rhythm  There were frequent episodes of SVT longest lasting 33 seconds an average  rate of 167.  There are some wide-complex tachycardia episodes which may represent  aberrant SVT  There were rare PACs and PVCs.   Patient events correlated with SVT   PRELIMINARY FINDINGS  Patient had a min HR of 43 bpm, max HR of 279 bpm, and avg HR of 68 bpm.  Predominant underlying rhythm was Sinus Rhythm. 9 Ventricular Tachycardia  runs occurred, the run with the fastest interval lasting 7 beats with a  max rate of 226 bpm, the longest lasting 16 beats with an avg rate of 198  bpm. Episodes of Ventricular Tachycardia may be Supraventricular  Tachycardia with possible aberrancy. [id number] Supraventricular Tachycardia  runs occurred, the run with the fastest interval lasting 6.7 secs with a  max rate of 279 bpm, the longest lasting 33.6 secs with an avg rate of 167  bpm. Some episodes of Supraventricular Tachycardia may be possible Atrial  Tachycardia with variable block. Some  episodes of Supraventricular  Tachycardia conducted with possible aberrancy. Atrial Fibrillation  occurred  1% burden , ranging from 89-198 bpm  avg of 134 bpm , the  longest lasting 35 mins 5 secs with an avg rate of 117 bpm.  Supraventricular Tachycardia and Atrial Fibrillation were detected within  +-- 45  seconds of symptomatic patient event s . Isolated SVEs were rare  <1.0% ,  SVE Couplets were rare  <1.0% , and SVE Triplets were rare  <1.0% .  Isolated VEs were rare  <1.0% ,  VE Couplets were rare  <1.0% , and no VE  Triplets were present. Ventricular Trigeminy was present.   LHC (02/14/2024):  Findings: --LM: Normal --LAD: Normal --LCx: Non-dominant, normal --RCA: Dominant, normal --LVEDP: 7 --No significant LV-Ao gradient  Stress test:  PAF seen in recovery  Impression/Plan:   Problem List[5]   Cristina Quinn is a 68 y.o. female with past medical history of HTN and palpitation, who was referred to the cardiac arrhythmia clinic at [facility] for initial consultation for new onset AF.   Zio patch showed 1% A-fib burden.  Paroxysmal atrial fibrillation was observed in recovery at the time of stress testing.  Regarding AF risk management, she is treated for hypertension.  Plan to refer her to sleep medicine for evaluation for sleep apnea.   Regarding risk of stroke, CHADS-Vasc: 3 (age, HTN, female) Plan to start Eliquis 5 mg twice daily.  Regarding rhythm and rate control, currently she is on diltiazem and metoprolol and her symptoms have improved, although not completely disappeared.   During this visit, Malanie and I had a comprehensive discussion of arrhythmia management using principles of shared decision-making. This included a discussion of anti-arrhythmic medications including class I agents (e.g. flecainide), class III agents (e.g. amiodarone, dofetilide, sotalol, etc.), and both class II and IV agents (beta-blockers and calcium channel blockers). We reviewed the potentially life-threatening side effects of these medications, including (but not limited to) fatal tachyarrhythmias, pulmonary toxicity, liver toxicity, thyroid  disorders. We also reviewed the requisite monitoring required of some of these medications. In addition, we reviewed ablative therapy, including the potential benefits and risks of catheter ablation. These risks include death, myocardial infarction, stroke, cardiac perforation, vascular injury, and other less severe  complications. Amea demonstrated a clear understanding of these issues during our discussion. Our plans, determined together after thorough consideration, are outlined elsewhere in this note.   Plan:  Lifestyle modification Plan to refer to sleep medicine for evaluation for sleep apnea.  Start Eliquis 5 mg bid Continue BB and CCB Rhythm control discussed Return to EP clinic in 3 months   Total physician time spent in patient care was 60 minutes including chart and current medications review, patient examination and developing a treatment plan, and time charting and placing orders.    Patient Instructions:  There are no Patient Instructions on file for this visit.     Follow-up: No follow-ups on file.   Thank you for allowing me to participate in the care of [facility]. If you have any questions, please do not hesitate to contact me.   Janine Decamp, MD, PhD Cardiac Electrophysiology       [1] Past Medical History: Diagnosis Date  . Anemia   . Breast cancer screening by mammogram 12/17/2021  . Colon cancer screening 07/05/2021  . Encounter for Medicare annual wellness exam 07/05/2021  . Encounter for screening mammogram for malignant neoplasm of breast 07/05/2021  . Encounter to establish  care 07/05/2021  . Hyperlipidemia   . Hyperlipidemia   . Hypertension   . Screening for colon cancer 12/17/2021  . Thalassemia minor   [2] Past Surgical History: Procedure Laterality Date  . CARDIAC CATHETERIZATION Left 02/20/2024   CV CATHETERIZATION LEFT HEART performed by Reyes Maude Butt, MD at [facility] INVASIVE LAB  . MYOMECTOMY     Procedure: MYOMECTOMY  . OVARIAN CYST REMOVAL     Procedure: OVARIAN CYST REMOVAL  [3] Family History Problem Relation Name Age of Onset  . Alzheimer's disease Mother    . Osteoarthritis Mother    . Heart disease Mother    . Lung cancer Father    . Heart failure Maternal Aunt    . Lung cancer Paternal Uncle    . Lung cancer  Paternal Uncle    . Stroke Maternal Grandmother    . Alzheimer's disease Paternal Grandmother    . Heart attack Cousin         November 2024  . Breast cancer Neg Hx    [4] Outpatient Encounter Medications as of 02/24/2024  Medication Sig Dispense Refill  . ascorbic acid (VITAMIN C) 1,000 mg tablet Take 1,000 mg by mouth daily.    SABRA atorvastatin (LIPITOR) 20 mg tablet Take 20 mg by mouth Once Daily. Indications: high cholesterol and high triglycerides 90 tablet 1  . Bifidobacterium infantis (ALIGN JR ORAL) Take by mouth daily.    . calcium citrate-vitamin D3 315 mg-5 mcg (200 unit) tab Take 1 tablet by mouth 2 (two) times a day.    . cetirizine (ZyrTEC) 10 mg tablet Take 10 mg by mouth as needed for allergies.    . cholecalciferol (VITAMIN D3) 10 mcg/mL (400 unit/mL) drop Take by mouth.    . cyanocobalamin (VITAMIN B12) 100 mcg tablet Take 100 mcg by mouth daily.    . dilTIAZem (CARDIZEM CD) 180 mg 24 hr capsule Take 1 capsule (180 mg total) by mouth daily. 90 capsule 3  . fluticasone propionate (FLONASE) 50 mcg/spray nasal spray 1 spray Once Daily. 9.9 mL 0  . hydroCHLOROthiazide  (HYDRODIURIL ) 25 mg tablet Take 1 tablet (25 mg total) by mouth daily. 90 tablet 1  . loratadine (CLARITIN) 10 mg tablet Take 1 tablet (10 mg total) by mouth daily. 30 tablet 3  . metoprolol succinate (TOPROL XL) 25 mg 24 hr tablet Take 1 tablet (25 mg total) by mouth daily. 90 tablet 3  . multivitamin (THERAGRAN) tab tablet Take 1 tablet by mouth daily.    SABRA olopatadine (PATANOL) 0.1 % ophthalmic solution Administer 1 drop into each eyes 2 (two) times a day. 5 mL 0  . omega 3-dha-epa-fish oil (OMEGA 3) 1,000 mg capsule Take 1 g by mouth daily.    SABRA LORazepam (ATIVAN) 0.5 mg tablet TAKE 1 TABLET(0.5 MG) BY MOUTH EVERY NIGHT AS NEEDED FOR ANXIETY (Patient not taking: Reported on 02/24/2024) 30 tablet 0  . [DISCONTINUED] DOCOSAHEXAENOIC ACID ORAL Take 1 g by mouth. (Patient not taking: Reported on 05/07/2023)    .  [DISCONTINUED] magnesium 84 mg TbER Take 84 mg by mouth Once Daily. (Patient not taking: Reported on 05/07/2023)    . [DISCONTINUED] oxymetazoline (AFRIN) 0.05 % nasal spray 2 sprays 2 (two) times a day. (Patient not taking: Reported on 05/07/2023) 30 mL 2  . [DISCONTINUED] predniSONE (STERAPRED DS) 10 mg DsPk dose pack Use As Directed On Package. (Patient not taking: Reported on 09/03/2023) 21 tablet 0  . [DISCONTINUED] zinc gluconate 50 mg tab tablet Take 50  mg by mouth Once Daily. (Patient not taking: Reported on 05/07/2023)     No facility-administered encounter medications on file as of 02/24/2024.  [5] Patient Active Problem List Diagnosis  . Thalassemia minor  . Hair loss  . Caregiver stress  . Chronic pain of left knee  . Chronic right shoulder pain  . Hypertension  . Memory changes  . Seasonal allergies  . Family history of osteoporosis in mother  . Anxiety  . Vertigo  . Hand cramps  . Cramps of lower extremity  . History of vitamin D deficiency  . Acute maxillary sinusitis  . Flu  . Sinusitis  . Sensorineural hearing loss, bilateral  . Hypercholesterolemia  . Abnormal stress test

## 2024-02-27 NOTE — Progress Notes (Signed)
 Patient has wrist pain following radial artery cath. Will order arterial duplex to rule out complication.  Jeffrey P. Leda, MD, [facility], [facility] Interventional Cardiology and Critical Care Cardiology

## 2024-03-02 ENCOUNTER — Emergency Department (HOSPITAL_BASED_OUTPATIENT_CLINIC_OR_DEPARTMENT_OTHER)

## 2024-03-02 ENCOUNTER — Observation Stay (HOSPITAL_BASED_OUTPATIENT_CLINIC_OR_DEPARTMENT_OTHER)
Admission: EM | Admit: 2024-03-02 | Discharge: 2024-03-06 | Disposition: A | Discharge status: Home / Self Care | Attending: Internal Medicine | Admitting: Internal Medicine

## 2024-03-02 ENCOUNTER — Other Ambulatory Visit: Payer: Self-pay

## 2024-03-02 ENCOUNTER — Encounter (HOSPITAL_BASED_OUTPATIENT_CLINIC_OR_DEPARTMENT_OTHER): Payer: Self-pay

## 2024-03-02 DIAGNOSIS — Z7982 Long term (current) use of aspirin: Secondary | ICD-10-CM | POA: Diagnosis not present

## 2024-03-02 DIAGNOSIS — R079 Chest pain, unspecified: Secondary | ICD-10-CM | POA: Diagnosis present

## 2024-03-02 DIAGNOSIS — Z79899 Other long term (current) drug therapy: Secondary | ICD-10-CM | POA: Diagnosis not present

## 2024-03-02 DIAGNOSIS — I4891 Unspecified atrial fibrillation: Secondary | ICD-10-CM | POA: Insufficient documentation

## 2024-03-02 DIAGNOSIS — I742 Embolism and thrombosis of arteries of the upper extremities: Secondary | ICD-10-CM | POA: Diagnosis not present

## 2024-03-02 DIAGNOSIS — E785 Hyperlipidemia, unspecified: Secondary | ICD-10-CM | POA: Diagnosis not present

## 2024-03-02 DIAGNOSIS — I70208 Unspecified atherosclerosis of native arteries of extremities, other extremity: Principal | ICD-10-CM | POA: Diagnosis present

## 2024-03-02 DIAGNOSIS — I10 Essential (primary) hypertension: Secondary | ICD-10-CM | POA: Diagnosis not present

## 2024-03-02 DIAGNOSIS — I639 Cerebral infarction, unspecified: Secondary | ICD-10-CM

## 2024-03-02 LAB — CBC
HCT: 33.4 % — ABNORMAL LOW (ref 36.0–46.0)
Hemoglobin: 10.6 g/dL — ABNORMAL LOW (ref 12.0–15.0)
MCH: 23.8 pg — ABNORMAL LOW (ref 26.0–34.0)
MCHC: 31.7 g/dL (ref 30.0–36.0)
MCV: 74.9 fL — ABNORMAL LOW (ref 80.0–100.0)
Platelets: 384 K/uL (ref 150–400)
RBC: 4.46 MIL/uL (ref 3.87–5.11)
RDW: 14.9 % (ref 11.5–15.5)
WBC: 9.7 K/uL (ref 4.0–10.5)
nRBC: 0 % (ref 0.0–0.2)

## 2024-03-02 LAB — PROTIME-INR
INR: 1 (ref 0.8–1.2)
Prothrombin Time: 13.7 s (ref 11.4–15.2)

## 2024-03-02 LAB — BASIC METABOLIC PANEL WITH GFR
Anion gap: 12 (ref 5–15)
BUN: 16 mg/dL (ref 8–23)
CO2: 25 mmol/L (ref 22–32)
Calcium: 9.5 mg/dL (ref 8.9–10.3)
Chloride: 103 mmol/L (ref 98–111)
Creatinine, Ser: 0.85 mg/dL (ref 0.44–1.00)
GFR, Estimated: >60 mL/min (ref >60)
Glucose, Bld: 92 mg/dL (ref 70–99)
Potassium: 3.9 mmol/L (ref 3.5–5.1)
Sodium: 140 mmol/L (ref 135–145)

## 2024-03-02 LAB — APTT: aPTT: 26 s (ref 24–36)

## 2024-03-02 LAB — D-DIMER, QUANTITATIVE: D-Dimer, Quant: 0.56 ug{FEU}/mL — ABNORMAL HIGH (ref 0.00–0.50)

## 2024-03-02 LAB — TROPONIN T, HIGH SENSITIVITY: Troponin T High Sensitivity: <15 ng/L (ref 0–19)

## 2024-03-02 MED ORDER — HEPARIN BOLUS VIA INFUSION
4500.0000 [IU] | Freq: Once | INTRAVENOUS | Status: AC
  Administered 2024-03-02: 4500 [IU] via INTRAVENOUS

## 2024-03-02 MED ORDER — HEPARIN (PORCINE) 25000 UT/250ML-% IV SOLN
1300.0000 [IU]/h | INTRAVENOUS | Status: DC
  Administered 2024-03-02: 1300 [IU]/h via INTRAVENOUS
  Filled 2024-03-02: qty 250

## 2024-03-02 NOTE — Progress Notes (Signed)
 ANTICOAGULATION CONSULT NOTE  Pharmacy Consult for Heparin Indication: radial artery thrombosis  No Known Allergies  Patient Measurements:   Heparin Dosing Weight: 74.1 kg  Vital Signs: BP: 127/71 (09/29 2230) Pulse Rate: 52 (09/29 2230)  Labs: Recent Labs    03/02/24 1839 03/02/24 2149  HGB 10.6*  --   HCT 33.4*  --   PLT 384  --   APTT  --  26  LABPROT  --  13.7  INR  --  1.0  CREATININE 0.85  --     CrCl cannot be calculated (Unknown ideal weight.).   Medical History: Past Medical History:  Diagnosis Date   Hypertension     Medications:  (Not in a hospital admission)  Scheduled:  Infusions:  PRN:   Assessment: Cristina Quinn presenting with right arm pain following recent cardiac catheterization. 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  Patient reportedly recently prescribed apixaban but has not filled secondary to cost per ED MD note.  Hgb 10.6; plt 384 aPTT 26 PT/INR 13.7/1  Goal of Therapy:  Heparin level 0.3-0.7 units/ml Monitor platelets by anticoagulation protocol: Yes   Plan:  Give IV heparin 4500 units bolus x 1 Start heparin infusion at 1300 units/hr Check anti-Xa level at 0600 and daily while on heparin Continue to monitor H&H and platelets  Dorn Buttner, PharmD, BCPS 03/02/2024 11:11 PM ED Clinical Pharmacist -  [phone]

## 2024-03-02 NOTE — Telephone Encounter (Addendum)
 TRIAGE  SITUATION - Ultrasound results  BACKGROUND - HTN, Hypercholesterolemia  ASSESSMENT - Patient had heart cath 02/20/24. States had ultrasound, today, of (R) wrist & (R) arm, & has blocked artery. States pain is from (R) wrist to (R) elbow, but not as bad as it was. States was from (R) wrist to (R) shoulder. States sore to touch. Denies any swelling nor SHOB. States has had intermittent light chest pain x 1 week. Currently, denies any chest pain. States Dr Leda out of town until Mar 08, 2024. States hasn't gotten Eliquis filled, yet, d/t costs.  RECOMMENDATION - Advised patient to go to ER if symptoms worsen ie: chest pain, difficulty breathing. Advised that I will consult provider. Advised to call clinic if any further questions or concerns. Patient acknowledges in agreement, & verbalizes understanding.

## 2024-03-02 NOTE — Telephone Encounter (Signed)
 Pt had a cath on 9/18  She was in a lot of pain  Dr ordered a ultra sound  They discovered today that she has a   Blocked artery  Please call pt at [phone]

## 2024-03-02 NOTE — ED Provider Notes (Signed)
 [facility] Provider Note   CSN: [id number] Arrival date & time: 03/02/24  1824     Patient presents with: Chest Pain   Maxx Calaway is a 68 y.o. female.    Chest Pain     Patient presents with the right arm pain.  Patient states she been having pain along this arm after cardiac catheterization on the 18th.  Felt like her pain was get better today but went ahead with ultrasound.  She states that she was told that she has a claudication in one of her arteries so came to the ED for further evaluation.  Claudication with the arteries of her arm.  No chest pain or shortness of breath.  Otherwise, denies all complaints.  Was prescribed Eliquis but she states that she has not had it filled yet because of the price issue.  Also endorses episodes of chest discomfort.  Only last for couple seconds at a time.  She is not aware of any kind of exertional component.  No exacerbating or alleviating factors.  She wondered if it might be more acid reflux related.  No abdominal pain.  No blood or chest pain or hemoptysis.  No history of DVT or PE.   Previous medical history reviewed : Patient had left heart catheterization on September 18.   Prior to Admission medications   Medication Sig Start Date End Date Taking? Authorizing Provider  hydrochlorothiazide  (HYDRODIURIL ) 25 MG tablet Take 1 tablet (25 mg total) by mouth daily. 10/26/19 11/25/19  Layden, Lindsey A, PA-C    Allergies: Patient has no known allergies.    Review of Systems  Cardiovascular:  Positive for chest pain.    Updated Vital Signs BP 127/71   Pulse (!) 52   Resp 14   Ht 5' 4 (1.626 m)   Wt 87.4 kg   SpO2 99%   BMI 33.07 kg/m   Physical Exam Vitals and nursing note reviewed.  Constitutional:      General: She is not in acute distress.    Appearance: She is well-developed.  HENT:     Head: Normocephalic and atraumatic.  Eyes:     Conjunctiva/sclera: Conjunctivae  normal.  Cardiovascular:     Rate and Rhythm: Normal rate and regular rhythm.     Heart sounds: No murmur heard. Pulmonary:     Effort: Pulmonary effort is normal. No respiratory distress.     Breath sounds: Normal breath sounds.  Abdominal:     Palpations: Abdomen is soft.     Tenderness: There is no abdominal tenderness.  Musculoskeletal:        General: No swelling.     Cervical back: Neck supple.  Skin:    General: Skin is warm and dry.     Capillary Refill: Capillary refill takes less than 2 seconds.  Neurological:     Mental Status: She is alert.  Psychiatric:        Mood and Affect: Mood normal.     (all labs ordered are listed, but only abnormal results are displayed) Labs Reviewed  CBC - Abnormal; Notable for the following components:      Result Value   Hemoglobin 10.6 (*)    HCT 33.4 (*)    MCV 74.9 (*)    MCH 23.8 (*)    All other components within normal limits  D-DIMER, QUANTITATIVE - Abnormal; Notable for the following components:   D-Dimer, Quant 0.56 (*)    All other components within  normal limits  BASIC METABOLIC PANEL WITH GFR  PROTIME-INR  APTT  HEPARIN LEVEL (UNFRACTIONATED)  TROPONIN T, HIGH SENSITIVITY  TROPONIN T, HIGH SENSITIVITY    EKG: EKG Interpretation Date/Time:  Monday March 02 2024 18:39:04 EDT Ventricular Rate:  53 PR Interval:  183 QRS Duration:  99 QT Interval:  457 QTC Calculation: 430 R Axis:   70  Text Interpretation: Sinus rhythm Nonspecific T abnrm, anterolateral leads Confirmed by Simon Rea [phone]) on 03/02/2024 9:28:45 PM  Radiology: [facility] Chest Port 1 View Result Date: 03/02/2024 EXAM: 1 VIEW(S) XRAY OF THE CHEST 03/02/2024 10:04:32 PM COMPARISON: None available. CLINICAL HISTORY: CP. Intermittent chest pain x 11 days. Hx HTN. FINDINGS: LUNGS AND PLEURA: No focal pulmonary opacity. No pulmonary edema. No pleural effusion. No pneumothorax. HEART AND MEDIASTINUM: No acute abnormality of the cardiac and mediastinal  silhouettes. BONES AND SOFT TISSUES: Multilevel thoracic osteophytosis. IMPRESSION: 1. No acute cardiopulmonary pathology related to the clinical history of intermittent chest pain. 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 No hematoma. No pseudoaneurysm.  [TL]  2301 Per Cards: Usually want vascular surgery input about anticoagulation. Admitted. Heparin drip. See how arm is overnight. If things get worse then consider admission.  [TL]    Clinical Course User Index [TL] Simon Rea SAILOR, MD                                 Medical Decision Making Amount and/or Complexity of Data Reviewed Labs: ordered. Radiology: ordered.  Risk Prescription drug management. Decision regarding hospitalization.      Patient presents with the right arm pain.  Patient states she been having pain along this arm after cardiac catheterization on the 18th.  Felt like her pain was get better today but went ahead with ultrasound.  She states that she was told that she has a claudication in one of her arteries so came to the ED for further evaluation.  Claudication with the arteries of her arm.  No chest pain or shortness of breath.  Otherwise, denies all complaints.  Was prescribed Eliquis but she states that she has not had it filled yet because of the price issue.  Also endorses episodes of chest discomfort.  Only last for couple seconds at a time.  She is not aware of any kind of exertional component.  No exacerbating or alleviating factors.  She wondered  if it might be more acid reflux related.  No abdominal pain.  No blood or chest pain or hemoptysis.  No history of DVT or PE.   Previous medical history reviewed : Patient had left heart catheterization on September 18.  Patient was sent in because she is concerned about some form of clot in the radial artery.  Not able to see the actual read from the ultrasound.  Was able to get a hold of radiology over at [facility].  She does have a occlusion of the right radial artery from the proximal mid forearm to  the distal forearm segment.  No hematoma.  No pseudoaneurysm.  Her hand is warm and well-perfused.  Appropriate capillary refill.  Strength and sensation intact of this hand.  At the site of likely vascular access, it is hard to palpation over the radial artery.  Seems like he is getting good flow through the ulna.  I consulted vascular surgery.  No acute intervention needed.  Spoke to cardiology as well.  Usually they admit the patient on heparin and subsequently convert to oral anticoagulation after being observed for a day or so.  Recommends overnight observation on heparin and subsequently will convert to the oral anticoagulation.  I think this is probably appropriate to make sure she has shows no further development of ischemic disease.  I tried to reach out to cardiology at [facility] to discuss the patient but was unable to reach cardiology team through secretary placed calls.   Did have episode of chest pain.  Unremarkable workup..  D-dimer unremarkable in the setting of patient's age-adjusted cutoff.  Troponin unremarkable.  Patient to be started on heparin.  Will be admitted.        Final diagnoses:  Radial artery occlusion, right  Chest pain, unspecified type    ED Discharge Orders     None          Simon Lavonia SAILOR, MD 03/02/24 2353

## 2024-03-02 NOTE — Telephone Encounter (Signed)
 TRIAGE  VM left at patient's phone [phone] to go to ER for evaluation.   Spoke with patient & husband Vira) on husband's phone [phone], & advised to go to ER for evaluation. Patient & husband acknowledges in agreement, & verbalizes understanding.

## 2024-03-02 NOTE — ED Triage Notes (Signed)
 Reports intermittent mid chest pain for 11 days. States had ultrasound done at PCP, found blocked vein in R arm Sent by PCP  Denies SHOB, N/V No obvious swelling or discoloration to RUE.  Radial pulse 2+ BIL

## 2024-03-03 ENCOUNTER — Other Ambulatory Visit (HOSPITAL_COMMUNITY): Payer: Self-pay

## 2024-03-03 ENCOUNTER — Telehealth (HOSPITAL_COMMUNITY): Payer: Self-pay | Admitting: Pharmacy Technician

## 2024-03-03 ENCOUNTER — Encounter (HOSPITAL_COMMUNITY): Payer: Self-pay | Admitting: Internal Medicine

## 2024-03-03 DIAGNOSIS — I70208 Unspecified atherosclerosis of native arteries of extremities, other extremity: Secondary | ICD-10-CM | POA: Diagnosis present

## 2024-03-03 DIAGNOSIS — Z743 Need for continuous supervision: Secondary | ICD-10-CM | POA: Diagnosis not present

## 2024-03-03 DIAGNOSIS — R079 Chest pain, unspecified: Secondary | ICD-10-CM | POA: Diagnosis present

## 2024-03-03 DIAGNOSIS — R531 Weakness: Secondary | ICD-10-CM | POA: Diagnosis not present

## 2024-03-03 LAB — HEPARIN LEVEL (UNFRACTIONATED)
Heparin Unfractionated: >1.1 [IU]/mL — ABNORMAL HIGH (ref 0.30–0.70)
Heparin Unfractionated: >1.1 [IU]/mL — ABNORMAL HIGH (ref 0.30–0.70)

## 2024-03-03 MED ORDER — ATORVASTATIN CALCIUM 10 MG PO TABS
20.0000 mg | ORAL_TABLET | Freq: Every day | ORAL | Status: DC
  Administered 2024-03-03 – 2024-03-05 (×3): 20 mg via ORAL
  Filled 2024-03-03 (×3): qty 2

## 2024-03-03 MED ORDER — CHLORHEXIDINE GLUCONATE CLOTH 2 % EX PADS
6.0000 | MEDICATED_PAD | Freq: Every day | CUTANEOUS | Status: DC
  Administered 2024-03-03 – 2024-03-05 (×2): 6 via TOPICAL

## 2024-03-03 MED ORDER — ORAL CARE MOUTH RINSE: 15.0000 mL | OROMUCOSAL | Status: DC | PRN

## 2024-03-03 MED ORDER — HEPARIN (PORCINE) 25000 UT/250ML-% IV SOLN
850.0000 [IU]/h | INTRAVENOUS | Status: AC
  Administered 2024-03-03 – 2024-03-05 (×2): 850 [IU]/h via INTRAVENOUS
  Filled 2024-03-03: qty 250

## 2024-03-03 MED ORDER — HYDROCHLOROTHIAZIDE 25 MG PO TABS
25.0000 mg | ORAL_TABLET | Freq: Every day | ORAL | Status: DC
Start: 2024-03-03 — End: 2024-03-05
  Administered 2024-03-03 – 2024-03-05 (×3): 25 mg via ORAL
  Filled 2024-03-03 (×3): qty 1

## 2024-03-03 MED ORDER — DILTIAZEM HCL ER COATED BEADS 180 MG PO CP24
180.0000 mg | ORAL_CAPSULE | Freq: Every day | ORAL | Status: DC
Start: 2024-03-03 — End: 2024-03-06
  Administered 2024-03-03 – 2024-03-05 (×3): 180 mg via ORAL
  Filled 2024-03-03 (×3): qty 1

## 2024-03-03 MED ORDER — INFLUENZA VAC SPLIT HIGH-DOSE 0.5 ML IM SUSY: 0.5000 mL | PREFILLED_SYRINGE | INTRAMUSCULAR | Status: DC | PRN

## 2024-03-03 MED ORDER — HEPARIN (PORCINE) 25000 UT/250ML-% IV SOLN
1100.0000 [IU]/h | INTRAVENOUS | Status: DC
  Administered 2024-03-03 (×2): 1100 [IU]/h via INTRAVENOUS

## 2024-03-03 MED ORDER — METOPROLOL SUCCINATE ER 25 MG PO TB24
25.0000 mg | ORAL_TABLET | Freq: Every day | ORAL | Status: DC
  Administered 2024-03-04: 25 mg via ORAL
  Filled 2024-03-03 (×3): qty 1

## 2024-03-03 MED ORDER — FENTANYL CITRATE PF 50 MCG/ML IJ SOSY: 50.0000 ug | PREFILLED_SYRINGE | Freq: Once | INTRAMUSCULAR | Status: DC

## 2024-03-03 NOTE — Telephone Encounter (Signed)
 Patient Product/process development scientist completed.    The patient is insured through [employer]. Patient has Medicare and is not eligible for a copay card, but may be able to apply for patient assistance or Medicare RX Payment Plan (Patient Must reach out to their plan, if eligible for payment plan), if available.    Ran test claim for Eliquis 5 mg and the current 30 day co-pay is $456.62 due to a deductible.  Will be $47.00 once deductible is met  Ran test claim for Xarelto 20 mg and the current 30 day co-pay is $456.62 due to a deductible.  Will be $47.00 once deductible is met  This test claim was processed through [facility]- copay amounts may vary at other pharmacies due to pharmacy/plan contracts, or as the patient moves through the different stages of their insurance plan.     Reyes Sharps, CPHT Pharmacy Technician III Certified Patient Advocate [facility] Patient Advocate Team Direct Number: [phone]  Fax: [phone]

## 2024-03-03 NOTE — H&P (Addendum)
 History and Physical    Patient: Cristina Quinn FMW:[id number] DOB: 04-May-1956 DOA: 03/02/2024 DOS: the patient was seen and examined on 03/03/2024 PCP: Siganporia, Arnaz, FNP  Patient coming from: Home  Chief Complaint:  Chief Complaint  Patient presents with   Chest Pain   HPI: Cristina Quinn is a 68 y.o. female with medical history significant of paroxysmal Afib (dx at [facility] on 9/22 and started on Eliquis but pt unable to afford and has not started), and HTN p/w R wrist pain and found to have radial artery occlusion for which Cards recommened IP admission for hep gtt.  The patient is very confused during my interview, which she states is not her baseline and attributes this to her poor sleep last night. From what I can gather from Epic review and brief interview, the patient underwent a cardiac catheterization for known arrhythmia on 9/18, which demonstrated nl coronary arteries, and had subsequent R wrist pain. She contacted OP cards team and Dr. Leda at [facility] ordered arterial duplex on 9/25, which demonstrated occlusion of the right radial artery from the proximal mid forearm to the distal forearm segment per EDP. Of note, pt was prescribed Eliquis for known Afib on 9/22 at [facility] but has been unable to pick up this medication due to cost.  In the ED, pt AFVSS. Labs notable for D-dimer 0.56 (positive >0.5). EDP consulted Cards who recommended IP admission for hep gtt, as well as vascular surgery and medicine for admission.   Review of Systems: As mentioned in the history of present illness. All other systems reviewed and are negative. Past Medical History:  Diagnosis Date   Hypertension    Past Surgical History:  Procedure Laterality Date   ABDOMINAL HYSTERECTOMY     Social History:  reports that she has never smoked. She has never used smokeless tobacco. She reports current alcohol use. She reports that she does not use drugs.  No Known Allergies  History  reviewed. No pertinent family history.  Prior to Admission medications   Medication Sig Start Date End Date Taking? Authorizing Provider  atorvastatin (LIPITOR) 20 MG tablet Take 20 mg by mouth daily.   Yes [provider]  diltiazem (CARDIZEM CD) 180 MG 24 hr capsule Take 180 mg by mouth daily. 11/13/23  Yes [provider]  hydrochlorothiazide  (HYDRODIURIL ) 25 MG tablet Take 1 tablet (25 mg total) by mouth daily. 10/26/19 03/03/24 Yes Layden, Lindsey A, PA-C  meclizine (ANTIVERT) 25 MG tablet Take 25 mg by mouth 3 (three) times daily as needed for dizziness or nausea. 12/12/21  Yes [provider]  metoprolol succinate (TOPROL-XL) 25 MG 24 hr tablet Take 25 mg by mouth daily. 01/01/24  Yes [provider]  apixaban (ELIQUIS) 5 MG TABS tablet Take 5 mg by mouth 2 (two) times daily. Patient not taking: Reported on 03/03/2024 02/24/24   [provider]    Physical Exam: Vitals:   03/03/24 0330 03/03/24 0522 03/03/24 0827 03/03/24 1100  BP: 127/88 130/78 124/74 117/60  Pulse: (!) 49 (!) 58 (!) 50 (!) 55  Resp: 13 20 17 16   Temp:  98.3 F (36.8 C) 97.7 F (36.5 C) 97.8 F (36.6 C)  TempSrc:  Oral Oral Oral  SpO2: 97% 100% 99% 96%  Weight:      Height:       General: Alert, oriented x3, resting comfortably in no acute distress Respiratory: Lungs clear to auscultation bilaterally with normal respiratory effort; no w/r/r Cardiovascular: Irregularly irregular;  no murmurs   Data Reviewed:  Lab Results  Component Value Date   WBC 9.7 03/02/2024   HGB 10.6 (L) 03/02/2024   HCT 33.4 (L) 03/02/2024   MCV 74.9 (L) 03/02/2024   PLT 384 03/02/2024   Lab Results  Component Value Date   GLUCOSE 92 03/02/2024   CALCIUM 9.5 03/02/2024   NA 140 03/02/2024   K 3.9 03/02/2024   CO2 25 03/02/2024   CL 103 03/02/2024   BUN 16 03/02/2024   CREATININE 0.85 03/02/2024   Lab Results  Component Value Date   ALT 14 11/19/2023   AST 20 11/19/2023    ALKPHOS 39 11/19/2023   BILITOT 0.6 11/19/2023   Lab Results  Component Value Date   INR 1.0 03/02/2024   Radiology: [facility] Chest Port 1 View Result Date: 03/02/2024 EXAM: 1 VIEW(S) XRAY OF THE CHEST 03/02/2024 10:04:32 PM COMPARISON: None available. CLINICAL HISTORY: CP. Intermittent chest pain x 11 days. Hx HTN. FINDINGS: LUNGS AND PLEURA: No focal pulmonary opacity. No pulmonary edema. No pleural effusion. No pneumothorax. HEART AND MEDIASTINUM: No acute abnormality of the cardiac and mediastinal silhouettes. BONES AND SOFT TISSUES: Multilevel thoracic osteophytosis. IMPRESSION: 1. No acute cardiopulmonary pathology related to the clinical history of intermittent chest pain. Electronically signed by: Norman Gatlin MD 03/02/2024 10:13 PM EDT RP Workstation: [id number]     Assessment and Plan: 89F h/o paroxysmal Afib (dx at [facility] on 9/22 and started on Eliquis but pt unable to afford and has not started), and HTN p/w R wrist pain and found to have radial artery occlusion for which Cards recommened IP admission for hep gtt.  R radial artery occlusion -Cards consulted per EDP; apprec eval/recs (on hep gtt for now; will need OP OAC per below) -Vascular surgery consulted per EDP; apprec eval/recs  Afib CHADsVASc 3 -PTA metoprolol XL 25mg  daily and diltiazem 180mg  daily -Continue hep gtt for now -Pt unable to afford Eliquis; will need OAC; should consider warfarin dosing while admitted  HTN -PTA hydrochlorothiazide   HLD -PTA atorvastatin   Advance Care Planning:   Code Status: Not on file   Consults: Cards  Family Communication: Husband, Morris  Severity of Illness: The appropriate patient status for this patient is INPATIENT. Inpatient status is judged to be reasonable and necessary in order to provide the required intensity of service to ensure the patient's safety. The patient's presenting symptoms, physical exam findings, and initial radiographic and laboratory  data in the context of their chronic comorbidities is felt to place them at high risk for further clinical deterioration. Furthermore, it is not anticipated that the patient will be medically stable for discharge from the hospital within 2 midnights of admission.   * I certify that at the point of admission it is my clinical judgment that the patient will require inpatient hospital care spanning beyond 2 midnights from the point of admission due to high intensity of service, high risk for further deterioration and high frequency of surveillance required.*   ------- I spent 56 minutes reviewing previous notes, at the bedside counseling/discussing the treatment plan, and performing clinical documentation.  Author: Marsha Ada, MD 03/03/2024 3:29 PM  For on call review [email or web address].

## 2024-03-03 NOTE — Plan of Care (Signed)
   Problem: Education: Goal: Knowledge of General Education information will improve Description Including pain rating scale, medication(s)/side effects and non-pharmacologic comfort measures Outcome: Progressing

## 2024-03-03 NOTE — Progress Notes (Signed)
 ANTICOAGULATION CONSULT NOTE  Pharmacy Consult for Heparin Indication: radial artery thrombosis  No Known Allergies  Patient Measurements: Height: 5' 4 (162.6 cm) Weight: 87.4 kg (192 lb 10.9 oz) IBW/kg (Calculated) : 54.7 Heparin Dosing Weight: 74.1 kg  Vital Signs: Temp: 98.2 F (36.8 C) (09/30 1925) Temp Source: Oral (09/30 1925) BP: 105/58 (09/30 1925) Pulse Rate: 63 (09/30 1925)  Labs: Recent Labs    03/02/24 1839 03/02/24 2149 03/03/24 0636 03/03/24 1821  HGB 10.6*  --   --   --   HCT 33.4*  --   --   --   PLT 384  --   --   --   APTT  --  26  --   --   LABPROT  --  13.7  --   --   INR  --  1.0  --   --   HEPARINUNFRC  --   --  >1.10* >1.10*  CREATININE 0.85  --   --   --     Estimated Creatinine Clearance: 67.8 mL/min (by C-G formula based on SCr of 0.85 mg/dL).   Assessment: 29 yof presenting with right arm pain following recent cardiac catheterization. 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  Heparin level elevated at >1.1 units/mL.  Lab drawn appropriately and no bleeding observed per RN.   Goal of Therapy:  Heparin level 0.3-0.7 units/ml Monitor platelets by anticoagulation protocol: Yes   Plan:  Hold IV heparin for 1.5 hrs (RN aware), then Resume IV heparin at 850 units/hr Recheck level with AM labs  Nikolay Demetriou D. Lendell, PharmD, BCPS, BCCCP 03/03/2024, 8:20 PM

## 2024-03-03 NOTE — Progress Notes (Signed)
 ANTICOAGULATION CONSULT NOTE  Pharmacy Consult for Heparin Indication: radial artery thrombosis  No Known Allergies  Patient Measurements: Height: 5' 4 (162.6 cm) Weight: 87.4 kg (192 lb 10.9 oz) IBW/kg (Calculated) : 54.7 Heparin Dosing Weight: 74.1 kg  Vital Signs: Temp: 97.7 F (36.5 C) (09/30 0827) Temp Source: Oral (09/30 0827) BP: 124/74 (09/30 0827) Pulse Rate: 50 (09/30 0827)  Labs: Recent Labs    03/02/24 1839 03/02/24 2149 03/03/24 0636  HGB 10.6*  --   --   HCT 33.4*  --   --   PLT 384  --   --   APTT  --  26  --   LABPROT  --  13.7  --   INR  --  1.0  --   HEPARINUNFRC  --   --  >1.10*  CREATININE 0.85  --   --     Estimated Creatinine Clearance: 67.8 mL/min (by C-G formula based on SCr of 0.85 mg/dL).   Medical History: Past Medical History:  Diagnosis Date   Hypertension     Medications:  Medications Prior to Admission  Medication Sig Dispense Refill Last Dose/Taking   atorvastatin (LIPITOR) 20 MG tablet Take 20 mg by mouth daily.   Past Week   diltiazem (CARDIZEM CD) 180 MG 24 hr capsule Take 180 mg by mouth daily.   Past Week   hydrochlorothiazide  (HYDRODIURIL ) 25 MG tablet Take 1 tablet (25 mg total) by mouth daily. 30 tablet 0 Past Week   meclizine (ANTIVERT) 25 MG tablet Take 25 mg by mouth 3 (three) times daily as needed for dizziness or nausea.   Unknown   metoprolol succinate (TOPROL-XL) 25 MG 24 hr tablet Take 25 mg by mouth daily.   03/01/2024   apixaban (ELIQUIS) 5 MG TABS tablet Take 5 mg by mouth 2 (two) times daily. (Patient not taking: Reported on 03/03/2024)   Not Taking   Scheduled:   Chlorhexidine Gluconate Cloth  6 each Topical Daily   Infusions:   heparin 1,300 Units/hr (03/03/24 0543)   PRN: Influenza vac split trivalent PF, mouth rinse  Assessment: 68 yof presenting with right arm pain following recent cardiac catheterization. 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  Heparin level >1.1, no  bleeding issues per pt, lab drawn appropriately on opposite arm.  Goal of Therapy:  Heparin level 0.3-0.7 units/ml Monitor platelets by anticoagulation protocol: Yes   Plan:  Hold heparin x1h Reduce heparin to 1100 units/h Repeat heparin level in 8h  Ozell Jamaica, PharmD, [place] Clinical Pharmacist [phone] Please check AMION for all [facility] numbers 03/03/2024

## 2024-03-04 ENCOUNTER — Observation Stay (HOSPITAL_COMMUNITY)

## 2024-03-04 DIAGNOSIS — I6389 Other cerebral infarction: Secondary | ICD-10-CM | POA: Diagnosis not present

## 2024-03-04 DIAGNOSIS — I70208 Unspecified atherosclerosis of native arteries of extremities, other extremity: Secondary | ICD-10-CM | POA: Diagnosis not present

## 2024-03-04 DIAGNOSIS — I4891 Unspecified atrial fibrillation: Secondary | ICD-10-CM | POA: Diagnosis not present

## 2024-03-04 DIAGNOSIS — I742 Embolism and thrombosis of arteries of the upper extremities: Secondary | ICD-10-CM | POA: Diagnosis not present

## 2024-03-04 DIAGNOSIS — I739 Peripheral vascular disease, unspecified: Secondary | ICD-10-CM

## 2024-03-04 DIAGNOSIS — I10 Essential (primary) hypertension: Secondary | ICD-10-CM | POA: Diagnosis not present

## 2024-03-04 DIAGNOSIS — E785 Hyperlipidemia, unspecified: Secondary | ICD-10-CM | POA: Diagnosis not present

## 2024-03-04 LAB — CBC
HCT: 31.9 % — ABNORMAL LOW (ref 36.0–46.0)
Hemoglobin: 10 g/dL — ABNORMAL LOW (ref 12.0–15.0)
MCH: 23.4 pg — ABNORMAL LOW (ref 26.0–34.0)
MCHC: 31.3 g/dL (ref 30.0–36.0)
MCV: 74.7 fL — ABNORMAL LOW (ref 80.0–100.0)
Platelets: 347 K/uL (ref 150–400)
RBC: 4.27 MIL/uL (ref 3.87–5.11)
RDW: 14.9 % (ref 11.5–15.5)
WBC: 9.2 K/uL (ref 4.0–10.5)
nRBC: 0 % (ref 0.0–0.2)

## 2024-03-04 LAB — HEPARIN LEVEL (UNFRACTIONATED)
Heparin Unfractionated: 0.59 [IU]/mL (ref 0.30–0.70)
Heparin Unfractionated: 0.61 [IU]/mL (ref 0.30–0.70)

## 2024-03-04 LAB — HEMOGLOBIN A1C
Hgb A1c MFr Bld: 5.1 % (ref 4.8–5.6)
Mean Plasma Glucose: 99.67 mg/dL

## 2024-03-04 MED ORDER — ACETAMINOPHEN 325 MG PO TABS
650.0000 mg | ORAL_TABLET | Freq: Four times a day (QID) | ORAL | Status: DC | PRN
  Administered 2024-03-04 – 2024-03-06 (×3): 650 mg via ORAL
  Filled 2024-03-04 (×3): qty 2

## 2024-03-04 MED ORDER — IOHEXOL 350 MG/ML SOLN
75.0000 mL | Freq: Once | INTRAVENOUS | Status: AC | PRN
  Administered 2024-03-04: 75 mL via INTRAVENOUS

## 2024-03-04 NOTE — Telephone Encounter (Signed)
 Chart reviewed while primary cardiologist is out of office. Right arterial duplex revealed Occlusion of the right radial artery from the proximal-mid forearm to distal forearm segment. Patent subclavian , axillary, brachial, and ulnar artery. No evidence of hematoma, pseudoaneurysm present.   Patient is currently admitted at [facility] and receiving heparin gtt with possible vascular consult. Will leave this telephone encounter in box as FYI.

## 2024-03-04 NOTE — Consult Note (Incomplete)
 NEUROLOGY CONSULT NOTE   Date of service: March 04, 2024 Patient Name: Cristina Quinn MRN:  [id number] DOB:  [birth year] Chief Complaint: *** Requesting Provider: Will Almarie MATSU, MD  History of Present Illness  Cristina Quinn is a 68 y.o. female with hx hypertension and recently diagnosed (9/22) with paroxysmal A-fib who presented to [facility] with chest and right arm pain. She had a cardiac catheterization on 9/18 and was prescribed Eliquis but did not pick up medication due to price issue. She reported having pain along right arm and after having an arterial duplex on 9/25, which was ordered by [facility] provider, results demonstrated an occlusion of the right radial artery per ED provider. Patient was started on heparin and admitted for observation. Admitting physician noted that patient seemed confused during interview and patient reported this was not her baseline, but attributed it to poor sleep the night before. During today's interview, she states that the other morning she felt off and she believes it was likely due to not getting enough sleep.  LKW: *** Modified rankin score: {Modified Rankin Scale:21264} IV Thrombolysis: ***Yes, *** No (reason) EVT: ***Yes, *** No (reason) ICH Score:***  NIHSS components Score: Comment  1a Level of Conscious 0[]  1[]  2[]  3[]      1b LOC Questions 0[]  1[]  2[]       1c LOC Commands 0[]  1[]  2[]       2 Best Gaze 0[]  1[]  2[]       3 Visual 0[]  1[]  2[]  3[]      4 Facial Palsy 0[]  1[]  2[]  3[]      5a Motor Arm - left 0[]  1[]  2[]  3[]  4[]  UN[]    5b Motor Arm - Right 0[]  1[]  2[]  3[]  4[]  UN[]    6a Motor Leg - Left 0[]  1[]  2[]  3[]  4[]  UN[]    6b Motor Leg - Right 0[]  1[]  2[]  3[]  4[]  UN[]    7 Limb Ataxia 0[]  1[]  2[]  UN[]      8 Sensory 0[]  1[]  2[]  UN[]      9 Best Language 0[]  1[]  2[]  3[]      10 Dysarthria 0[]  1[]  2[]  UN[]      11 Extinct. and Inattention 0[]  1[]  2[]       TOTAL:       ROS  Comprehensive ROS performed and pertinent positives  documented in HPI    Past History   Past Medical History:  Diagnosis Date   Hypertension     Past Surgical History:  Procedure Laterality Date   ABDOMINAL HYSTERECTOMY      Family History: History reviewed. No pertinent family history.  Social History  reports that she has never smoked. She has never used smokeless tobacco. She reports current alcohol use. She reports that she does not use drugs.  No Known Allergies  Medications   Current Facility-Administered Medications:    acetaminophen (TYLENOL) tablet 650 mg, 650 mg, Oral, Q6H PRN, Will Almarie MATSU, MD, 650 mg at 03/04/24 0830   atorvastatin (LIPITOR) tablet 20 mg, 20 mg, Oral, Daily, Georgina Basket, MD, 20 mg at 03/04/24 9177   Chlorhexidine Gluconate Cloth 2 % PADS 6 each, 6 each, Topical, Daily, Kakrakandy, Arshad N, MD, 6 each at 03/03/24 0843   diltiazem (CARDIZEM CD) 24 hr capsule 180 mg, 180 mg, Oral, Daily, Georgina Basket, MD, 180 mg at 03/04/24 0823   heparin ADULT infusion 100 units/mL (25000 units/250mL), 850 Units/hr, Intravenous, Continuous, Dang, Thuy D, RPH, Last Rate: 8.5 mL/hr at 03/03/24 2149, 850 Units/hr at 03/03/24 2149   hydrochlorothiazide  (HYDRODIURIL )  tablet 25 mg, 25 mg, Oral, Daily, Georgina Basket, MD, 25 mg at 03/04/24 9176   Influenza vac split trivalent PF (FLUZONE HIGH-DOSE) injection 0.5 mL, 0.5 mL, Intramuscular, Prior to discharge, Kakrakandy, Arshad N, MD   metoprolol succinate (TOPROL-XL) 24 hr tablet 25 mg, 25 mg, Oral, Daily, Georgina Basket, MD, 25 mg at 03/04/24 0825   Oral care mouth rinse, 15 mL, Mouth Rinse, PRN, Franky Redia SAILOR, MD  Vitals   Vitals:   03/04/24 0253 03/04/24 0310 03/04/24 0814 03/04/24 1224  BP: 96/63  121/60 98/73  Pulse: 63  (!) 52 (!) 52  Resp: 14 20 15 15   Temp: (!) 97.5 F (36.4 C)  98.2 F (36.8 C) 97.9 F (36.6 C)  TempSrc:   Oral Oral  SpO2: 100%     Weight:      Height:        Body mass index is 32.92 kg/m.   Physical Exam    Constitutional: Appears well-developed and well-nourished. Lying comfortably in bed, on heparin IV drip. Psych: Affect appropriate to situation.  Eyes: No scleral injection.  HENT: No OP obstruction.  Head: Normocephalic. Atraumatic. Cardiovascular: Normal rate and regular rhythm.  Respiratory: Effort normal, non-labored breathing.  GI: Soft.  No distension. There is no tenderness.  Skin: WDI.  Neurologic Examination   Mental Status: Patient is awake, alert, and oriented to person, place, timing, and event. She is able to comprehend to answer questions appropriately and follow commands. No dysarthria or aphasia noted. Patient's attention and concentration intact, and she is able to name objects without difficulty.  Cranial Nerves: II: PERRL, peripheral vision intact to wiggling fingers in all visual quadrants III,IV,VI: EOMI V: Sensory intact bilaterally to light touch. VII: Symmetrical smile and facial movements. VIII: hearing intact to speech IX,X: symmetrical palate raise XI: shoulder shrug and chin turn intact bilaterally. XII: Tongue protrudes midline Motor: Normal bulk and tone. 5/5 strength in upper and lower extremities bilaterally. Sensory: Intact to light touch bilaterally in all extremities. Reflexes: Patellar reflexes intact bilaterally Coordination: FTN intact without ataxia Gait: deferred  Labs/Imaging/Neurodiagnostic studies   CBC:  Recent Labs  Lab 2024-03-30 1839 03/04/24 0344  WBC 9.7 9.2  HGB 10.6* 10.0*  HCT 33.4* 31.9*  MCV 74.9* 74.7*  PLT 384 347   Basic Metabolic Panel:  Lab Results  Component Value Date   NA 140 30-Mar-2024   K 3.9 Mar 30, 2024   CO2 25 March 30, 2024   GLUCOSE 92 03-30-2024   BUN 16 03/30/24   CREATININE 0.85 2024-03-30   CALCIUM 9.5 03/30/2024   GFRNONAA >60 03/30/24   GFRAA >60 10/26/2019   Lipid Panel: No results found for: LDLCALC HgbA1c: No results found for: HGBA1C Urine Drug Screen: No results found for:  LABOPIA, COCAINSCRNUR, LABBENZ, AMPHETMU, THCU, LABBARB  Alcohol Level No results found for: [facility] INR  Lab Results  Component Value Date   INR 1.0 03/30/24   APTT  Lab Results  Component Value Date   APTT 26 03/30/2024   AED levels: No results found for: PHENYTOIN, ZONISAMIDE, LAMOTRIGINE, LEVETIRACETA  CT angio Head and Neck with contrast(Personally reviewed): Ordered   MRI Brain(Personally reviewed): 1. 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 2. Background mild cerebral white matter chronic small vessel ischemic disease    ASSESSMENT   Kaci Dillie is a 68 y.o. female ***  RECOMMENDATIONS  *** ______________________________________________________________________    Bonney Nidia Bunker, Student-PA [facility]

## 2024-03-04 NOTE — Consult Note (Addendum)
 Hospital Consult    Reason for Consult: Radial artery occlusion Requesting Physician:  Almarie Hoots MD MRN #:  [id number]  History of Present Illness: Cristina Quinn is a 69 y.o. female with a past medical history of hypertension and paroxysmal A-fib (recently diagnosed on 9/22 at outside facility) who was admitted to [facility] with chest discomfort and right upper extremity pain.  She reported recent diagnosis of a clot in her right radial artery.  Arterial duplex performed at [facility] on 9/25 demonstrated occlusion of the right radial artery from the proximal mid forearm to the distal forearm.  We were consulted for further management of the patient's right radial artery occlusion.  The patient states that she had a cardiac cath for known arrhythmia on 9/18 via right radial artery access.  She says about 3 days after her procedure she started having pain in her right wrist that extended up her arm and into her shoulder.  This got worse with time, prompting her to call her doctor and get an arterial duplex.  On exam today she says that her pain is doing much better.  She now only has pain around her right wrist.  She denies ever experiencing any right hand weakness, numbness, or coldness.   She is currently on a heparin drip for her radial artery occlusion and paroxysmal A-fib.  Past Medical History:  Diagnosis Date   Hypertension     Past Surgical History:  Procedure Laterality Date   ABDOMINAL HYSTERECTOMY      No Known Allergies  Prior to Admission medications   Medication Sig Start Date End Date Taking? Authorizing Provider  atorvastatin (LIPITOR) 20 MG tablet Take 20 mg by mouth daily.   Yes [provider]  diltiazem (CARDIZEM CD) 180 MG 24 hr capsule Take 180 mg by mouth daily. 11/13/23  Yes [provider]  hydrochlorothiazide  (HYDRODIURIL ) 25 MG tablet Take 1 tablet (25 mg total) by mouth daily. 10/26/19 03/03/24 Yes Layden, Lindsey A, PA-C   meclizine (ANTIVERT) 25 MG tablet Take 25 mg by mouth 3 (three) times daily as needed for dizziness or nausea. 12/12/21  Yes [provider]  metoprolol succinate (TOPROL-XL) 25 MG 24 hr tablet Take 25 mg by mouth daily. 01/01/24  Yes [provider]  apixaban (ELIQUIS) 5 MG TABS tablet Take 5 mg by mouth 2 (two) times daily. Patient not taking: Reported on 03/03/2024 02/24/24   [provider]    Social History   Socioeconomic History   Marital status: Married    Spouse name: Not on file   Number of children: Not on file   Years of education: Not on file   Highest education level: Not on file  Occupational History   Not on file  Tobacco Use   Smoking status: Never   Smokeless tobacco: Never  Vaping Use   Vaping status: Never Used  Substance and Sexual Activity   Alcohol use: Yes    Comment: social   Drug use: Never   Sexual activity: Not on file  Other Topics Concern   Not on file  Social History Narrative   Not on file   Social Drivers of Health   Financial Resource Strain: Not on file  Food Insecurity: No Food Insecurity (03/03/2024)   Hunger Vital Sign    Worried About Running Out of Food in the Last Year: Never true    Ran Out of Food in the Last Year: Never true  Transportation Needs: No Transportation Needs (03/03/2024)  PRAPARE - Administrator, Civil Service (Medical): No    Lack of Transportation (Non-Medical): No  Physical Activity: Not on file  Stress: Not on file  Social Connections: Moderately Integrated (03/03/2024)   Social Connection and Isolation Panel    Frequency of Communication with Friends and Family: Twice a week    Frequency of Social Gatherings with Friends and Family: Twice a week    Attends Religious Services: More than 4 times per year    Active Member of [employer] or Organizations: No    Attends Banker Meetings: Patient declined    Marital Status: Married  Catering manager Violence: Not At  Risk (03/03/2024)   Humiliation, Afraid, Rape, and Kick questionnaire    Fear of Current or Ex-Partner: No    Emotionally Abused: No    Physically Abused: No    Sexually Abused: No    History reviewed. No pertinent family history.  ROS: Otherwise negative unless mentioned in HPI  Physical Examination  Vitals:   03/04/24 0814 03/04/24 1224  BP: 121/60 98/73  Pulse: (!) 52   Resp: 15 15  Temp: 98.2 F (36.8 C) 97.9 F (36.6 C)  SpO2:     Body mass index is 32.92 kg/m.  General:  WDWN in NAD Gait: Not observed HENT: WNL, normocephalic Pulmonary: normal non-labored breathing Cardiac: Irregularly irregular Abdomen:  soft, NT/ND, no masses Skin: without rashes Vascular Exam/Pulses: Palpable left radial pulse.  Palpable right brachial and ulnar pulses.  Nonpalpable right radial pulse.  Doppler signals in the right radial, ulnar, and palmar arch Extremities: without ischemic changes, without Gangrene , without cellulitis; without open wounds; intact motor and sensation of the right upper extremity.  Right hand is warm to touch.  Grip strength 5/5 Musculoskeletal: no muscle wasting or atrophy  Neurologic: A&O X 3 Psychiatric:  The pt has Normal affect. Lymph:  Unremarkable  CBC    Component Value Date/Time   WBC 9.2 03/04/2024 0344   RBC 4.27 03/04/2024 0344   HGB 10.0 (L) 03/04/2024 0344   HCT 31.9 (L) 03/04/2024 0344   PLT 347 03/04/2024 0344   MCV 74.7 (L) 03/04/2024 0344   MCH 23.4 (L) 03/04/2024 0344   MCHC 31.3 03/04/2024 0344   RDW 14.9 03/04/2024 0344   LYMPHSABS 2.3 11/19/2023 1516   MONOABS 0.6 11/19/2023 1516   EOSABS 0.1 11/19/2023 1516   BASOSABS 0.0 11/19/2023 1516    BMET    Component Value Date/Time   NA 140 03/02/2024 1839   K 3.9 03/02/2024 1839   CL 103 03/02/2024 1839   CO2 25 03/02/2024 1839   GLUCOSE 92 03/02/2024 1839   BUN 16 03/02/2024 1839   CREATININE 0.85 03/02/2024 1839   CALCIUM 9.5 03/02/2024 1839   GFRNONAA >60 03/02/2024  1839   GFRAA >60 10/26/2019 1648    COAGS: Lab Results  Component Value Date   INR 1.0 03/02/2024     Non-Invasive Vascular Imaging:   Right upper extremity arterial duplex performed at outside facility on 02/27/2024 demonstrates occlusion of the right radial artery from the proximal mid forearm to the distal forearm   ASSESSMENT/PLAN: This is a 68 y.o. female admitted with right upper extremity pain    - The patient was admitted to [facility] 2 days ago with right upper extremity pain.  She recently underwent cardiac catheterization via the right radial artery access for known arrhythmia on 9/18 at outside facility.  She had an arterial duplex of her right  arm performed at [facility] on 9/25, which demonstrated occlusion of the right radial artery from the proximal mid forearm to the distal forearm - After she received results of her arterial duplex, she was instructed to go to the ED for further care.  She also has a recent diagnosis of paroxysmal atrial fibrillation and was started on Eliquis. - Upon admission she was started on heparin drip for her right radial artery occlusion and paroxysmal A-fib -We were consulted for management of her radial artery occlusion.  The patient states a couple of days after her cardiac catheterization she had pain in her right wrist extending up to her shoulder.  This pain has gotten significantly better.  She did not have any associated right hand weakness, numbness, pain, or coldness.  - On exam she has a palpable left radial pulse.  She has a palpable right brachial and ulnar pulse.  She does not have a palpable right radial pulse.  She does have Doppler signals in her right radial artery, ulnar artery, and palmar arch.  She has intact motor and sensation of the right hand with 5/5 grip strength.  Her right hand is warm to touch. - Given her exam, her hand is well-perfused through her ulnar artery.  She has no indications for vascular intervention.   She would benefit from continued anticoagulation.  She will likely be on anticoagulation indefinitely for her A-fib   Ahmed Holster PA-C [facility] [phone]   I have seen and evaluated the patient. I agree with the PA note as documented above.  Vascular surgery has been consulted for right radial artery occlusion.  She underwent right radial cath 02/20/2024 [facility] subsequent duplex showed right radial occlusion.  This has been managed as an outpatient with anticoagulation.  Today she has no right hand complaints including no numbness weakness tingling etc.  Her hand is functioning normally.  She has a palpable ulnar pulse at the wrist with a brisk Doppler signal in the palmar arch.  She is going to be continuing a DOAC due to her atrial fibrillation.  Medical management should be appropriate.  I discussed normally we can keep blood thinners on for short period of time and the artery will often recanalized.  In her case she will be on this long-term.  No role for surgical intervention.  Lonni DOROTHA Gaskins, MD [facility] [place] Office: [phone]

## 2024-03-04 NOTE — Progress Notes (Signed)
 ANTICOAGULATION CONSULT NOTE  Pharmacy Consult for Heparin Indication: radial artery thrombosis  No Known Allergies  Patient Measurements: Height: 5' 4 (162.6 cm) Weight: 87 kg (191 lb 12.8 oz) IBW/kg (Calculated) : 54.7 Heparin Dosing Weight: 74.1 kg  Vital Signs: Temp: 98.2 F (36.8 C) (10/01 0814) Temp Source: Oral (10/01 0814) BP: 121/60 (10/01 0814) Pulse Rate: 52 (10/01 0814)  Labs: Recent Labs    03/02/24 1839 03/02/24 2149 03/03/24 0636 03/03/24 1821 03/04/24 0344 03/04/24 0953  HGB 10.6*  --   --   --  10.0*  --   HCT 33.4*  --   --   --  31.9*  --   PLT 384  --   --   --  347  --   APTT  --  26  --   --   --   --   LABPROT  --  13.7  --   --   --   --   INR  --  1.0  --   --   --   --   HEPARINUNFRC  --   --    < > >1.10* 0.59 0.61  CREATININE 0.85  --   --   --   --   --    < > = values in this interval not displayed.   Estimated Creatinine Clearance: 67.6 mL/min (by C-G formula based on SCr of 0.85 mg/dL).  Assessment: 37 yof presenting with right arm pain following recent cardiac catheterization. 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  Heparin level now at goal x2 on 850 units/hr.  No bleeding observed per RN. CBC stable  Goal of Therapy:  Heparin level 0.3-0.7 units/ml Monitor platelets by anticoagulation protocol: Yes   Plan:  Continue heparin infusion at 850 units/hr Check heparin level daily while on heparin Continue to monitor H&H and platelets  Thank you for allowing pharmacy to be a part of this patient's care.  Shelba Collier, PharmD, BCPS Clinical Pharmacist

## 2024-03-04 NOTE — Consult Note (Signed)
 NEUROLOGY CONSULT NOTE   Date of service: March 04, 2024 Patient Name: Cristina Quinn MRN:  [id number] DOB:  Jul 01, 1955 Reason for Consultation: Small acute white matter stroke on MRI.  Requesting Provider: Will Almarie MATSU, MD  History of Present Illness  Cristina Quinn is a 68 y.o. female with a PMHx of hypertension and recently diagnosed (9/22) with paroxysmal A-fib who presented to [facility] with chest and right arm pain. She had a cardiac catheterization on 9/18 and was prescribed Eliquis but did not pick up medication due to price issue. She reported having pain along right arm and after having an arterial duplex on 9/25, which was ordered by [facility] provider, results demonstrated an occlusion of the right radial artery per ED provider. Patient was started on heparin and admitted for observation. Admitting physician noted that patient seemed confused during interview and patient reported this was not her baseline, but attributed it to poor sleep the night before. During today's interview, she states that the other morning she felt off and she believes it was likely due to not getting enough sleep.   MRI was obtained and revealed a small acute/subacute infarct within the left corona radiata.   Additional history obtained by admitting hospitalist has been reviewed: Cristina Quinn is a 68 y.o. female with medical history significant of paroxysmal Afib (dx at [facility] on 9/22 and started on Eliquis but pt unable to afford and has not started), and HTN p/w R wrist pain and found to have radial artery occlusion for which Cards recommened IP admission for hep gtt. The patient is very confused during my interview, which she states is not her baseline and attributes this to her poor sleep last night. From what I can gather from Epic review and brief interview, the patient underwent a cardiac catheterization for known arrhythmia on 9/18, which demonstrated nl coronary arteries, and had  subsequent R wrist pain. She contacted OP cards team and Dr. Leda at [facility] ordered arterial duplex on 9/25, which demonstrated occlusion of the right radial artery from the proximal mid forearm to the distal forearm segment per EDP. Of note, pt was prescribed Eliquis for known Afib on 9/22 at [facility] but has been unable to pick up this medication due to cost. In the ED, pt AFVSS. Labs notable for D-dimer 0.56 (positive >0.5). EDP consulted Cards who recommended IP admission for hep gtt, as well as vascular surgery and medicine for admission.    ROS  As per HPI.   Past History   Past Medical History:  Diagnosis Date   Hypertension     Past Surgical History:  Procedure Laterality Date   ABDOMINAL HYSTERECTOMY      Family History: History reviewed. No pertinent family history.  Social History  reports that she has never smoked. She has never used smokeless tobacco. She reports current alcohol use. She reports that she does not use drugs.  No Known Allergies  Medications   Current Facility-Administered Medications:    acetaminophen (TYLENOL) tablet 650 mg, 650 mg, Oral, Q6H PRN, Will Almarie MATSU, MD, 650 mg at 03/04/24 0830   atorvastatin (LIPITOR) tablet 20 mg, 20 mg, Oral, Daily, Georgina Basket, MD, 20 mg at 03/04/24 9177   Chlorhexidine Gluconate Cloth 2 % PADS 6 each, 6 each, Topical, Daily, Kakrakandy, Arshad N, MD, 6 each at 03/03/24 0843   diltiazem (CARDIZEM CD) 24 hr capsule 180 mg, 180 mg, Oral, Daily, Georgina Basket, MD, 180 mg at 03/04/24 [id number]  heparin ADULT infusion 100 units/mL (25000 units/250mL), 850 Units/hr, Intravenous, Continuous, Dang, Thuy D, RPH, Last Rate: 8.5 mL/hr at 03/03/24 2149, 850 Units/hr at 03/03/24 2149   hydrochlorothiazide  (HYDRODIURIL ) tablet 25 mg, 25 mg, Oral, Daily, Georgina Basket, MD, 25 mg at 03/04/24 9176   Influenza vac split trivalent PF (FLUZONE HIGH-DOSE) injection 0.5 mL, 0.5 mL, Intramuscular, Prior to discharge, Kakrakandy,  Arshad N, MD   metoprolol succinate (TOPROL-XL) 24 hr tablet 25 mg, 25 mg, Oral, Daily, Georgina Basket, MD, 25 mg at 03/04/24 0825   Oral care mouth rinse, 15 mL, Mouth Rinse, PRN, Franky Redia SAILOR, MD  Vitals   Vitals:   03/04/24 0252 03/04/24 0253 03/04/24 0310 03/04/24 0814  BP: 96/63 96/63  121/60  Pulse: (!) 53 63  (!) 52  Resp: 13 14 20 15   Temp: (!) 97.5 F (36.4 C) (!) 97.5 F (36.4 C)  98.2 F (36.8 C)  TempSrc: Oral   Oral  SpO2: 100% 100%    Weight: 87 kg     Height:        Body mass index is 32.92 kg/m.   Physical Exam   Constitutional: Appears well-developed and well-nourished. Lying comfortably in bed, on heparin IV drip. Psych: Affect appropriate to situation.  Eyes: No scleral injection.  HENT: No OP obstruction.  Head: Normocephalic. Atraumatic. Cardiovascular: Normal rate and regular rhythm.  Respiratory: Effort normal, non-labored breathing.  GI: Soft.  No distension. There is no tenderness.  Skin: WDI.  Neurologic Examination   Mental Status: Patient is awake, alert, and oriented to person, place, timing, and event. She is able to comprehend to answer questions appropriately and follow commands. No dysarthria or aphasia noted. Patient's attention and concentration intact, and she is able to name objects without difficulty. Cranial Nerves: II: PERRL, peripheral vision intact to wiggling fingers in all visual quadrants III,IV,VI: EOMI V: Sensory intact bilaterally to light touch. VII: Symmetrical smile and facial movements. VIII: hearing intact to speech IX,X: symmetrical palate elevation XI: shoulder shrug and chin turn intact bilaterally. XII: Tongue protrudes midline Motor: Normal bulk and tone. 5/5 strength in upper and lower extremities bilaterally. Sensory: Intact to light touch bilaterally in all extremities. Reflexes: Patellar reflexes intact bilaterally Coordination: FTN intact without ataxia Gait:  deferred  Labs/Imaging/Neurodiagnostic studies   CBC:  Recent Labs  Lab Mar 03, 2024 1839 03/04/24 0344  WBC 9.7 9.2  HGB 10.6* 10.0*  HCT 33.4* 31.9*  MCV 74.9* 74.7*  PLT 384 347   Basic Metabolic Panel:  Lab Results  Component Value Date   NA 140 03/03/24   K 3.9 2024/03/03   CO2 25 03/03/2024   GLUCOSE 92 03/03/2024   BUN 16 2024/03/03   CREATININE 0.85 03-Mar-2024   CALCIUM 9.5 2024-03-03   GFRNONAA >60 03/03/2024   GFRAA >60 10/26/2019   INR  Lab Results  Component Value Date   INR 1.0 03-Mar-2024   APTT  Lab Results  Component Value Date   APTT 26 March 03, 2024   MRI Brain(Personally reviewed): 1. 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 2. Background mild cerebral white matter chronic small vessel ischemic disease  ASSESSMENT  Cristina Quinn is a 68 y.o. female with atrial fibrillation and HTN, whose MRI brain here reveals a small subacute left corona radiata ischemic infarction. Her stroke is asymptomatic.  - Exam is nonfocal - EKG: Sinus bradycardia; Otherwise normal ECG   - Impression: Small subacute left corona radiata ischemic infarction. DDx for underlying etiology includes cardioembolic and secondary  to chronic small vessel disease.   RECOMMENDATIONS  1. HgbA1c, fasting lipid panel 2. CTA of head and neck 3. PT consult, OT consult, Speech consult 4. Echocardiogram 5. Continue her atorvastatin. Obtain baseline CK level.  6. Currently on IV heparin 7. Risk factor modification 8. Telemetry monitoring 9. Frequent neuro checks 10 NPO until passes stroke swallow screen 11. BP management per standard protocol. Based on the appearance of the stroke on MRI, she is most likely outside of the 24 hour permissive HTN time window.  ______________________________________________________________________    Bonney SHARK, Kyler Germer, MD [facility]

## 2024-03-04 NOTE — Progress Notes (Signed)
 PROGRESS NOTE    Cristina Quinn  FMW:[id number] DOB: 07-18-1955 DOA: 03/02/2024 PCP: Siganporia, Arnaz, FNP   Brief Narrative:  68 y.o. female with medical history significant of paroxysmal Afib (dx at [facility] on 9/22 and started on Eliquis but pt unable to afford and has not started), and HTN p/w R wrist pain and found to have radial artery occlusion for which Cards recommened IP admission for hep gtt.  the patient underwent a cardiac catheterization for known arrhythmia on 9/18, which demonstrated nl coronary arteries, and had subsequent R wrist pain. She contacted OP cards team and Dr. Leda at [facility] ordered arterial duplex on 9/25, which demonstrated occlusion of the right radial artery from the proximal mid forearm to the distal forearm segment per EDP. Of note, pt was prescribed Eliquis for known Afib on 9/22 at [facility] but has been unable to pick up this medication due to cost.  Assessment & Plan:   Principal Problem:   Radial artery occlusion, right  R radial artery occlusion on heparin drip Appreciate vascular surgery input She denies pain paresthesias to right hand Warm to touch   Acute to subacute corona radiata infarct Neuro consulted A1C,LIPIDS NORMAL TSH On heparin Echo ct angio  Afib CHADsVASc 3 -PTA metoprolol XL 25mg  daily and diltiazem 180mg  daily -Continue hep gtt for now -Pt is able to afford Eliquis now -will dc her on eliquis   HTN -PTA hydrochlorothiazide    HLD -PTA atorvastatin  Estimated body mass index is 32.92 kg/m as calculated from the following:   Height as of this encounter: 5' 4 (1.626 m).   Weight as of this encounter: 87 kg.  DVT prophylaxis heparin Code Status: full Family Communication: none Disposition Plan:  Status is  Consultants:  NEURO VASCULAR  Procedures: mri Antimicrobials:none  Subjective: Awake alert denies any c/o denies pain   Objective: Vitals:   03/04/24 0253 03/04/24 0310 03/04/24  0814 03/04/24 1224  BP: 96/63  121/60 98/73  Pulse: 63  (!) 52   Resp: 14 20 15 15   Temp: (!) 97.5 F (36.4 C)  98.2 F (36.8 C) 97.9 F (36.6 C)  TempSrc:   Oral Oral  SpO2: 100%     Weight:      Height:        Intake/Output Summary (Last 24 hours) at 03/04/2024 1431 Last data filed at 03/04/2024 0300 Gross per 24 hour  Intake 44.03 ml  Output --  Net 44.03 ml   Filed Weights   03/02/24 2314 03/04/24 0252  Weight: 87.4 kg 87 kg    Examination:  General exam: Appears calm and comfortable  Respiratory system: Clear to auscultation. Respiratory effort normal. Cardiovascular system: irreg irreg Gastrointestinal system:  soft nt Central nervous system: Alert and oriented. Extremities: no edema   Data Reviewed: I have personally reviewed following labs and imaging studies  CBC: Recent Labs  Lab 03/02/24 1839 03/04/24 0344  WBC 9.7 9.2  HGB 10.6* 10.0*  HCT 33.4* 31.9*  MCV 74.9* 74.7*  PLT 384 347   Basic Metabolic Panel: Recent Labs  Lab 03/02/24 1839  NA 140  K 3.9  CL 103  CO2 25  GLUCOSE 92  BUN 16  CREATININE 0.85  CALCIUM 9.5   GFR: Estimated Creatinine Clearance: 67.6 mL/min (by C-G formula based on SCr of 0.85 mg/dL). Liver Function Tests: No results for input(s): AST, ALT, ALKPHOS, BILITOT, PROT, ALBUMIN in the last 168 hours. No results for input(s): LIPASE, AMYLASE in the last  168 hours. No results for input(s): AMMONIA in the last 168 hours. Coagulation Profile: Recent Labs  Lab 03/02/24 2149  INR 1.0   Cardiac Enzymes: No results for input(s): CKTOTAL, CKMB, CKMBINDEX, TROPONINI in the last 168 hours. BNP (last 3 results) No results for input(s): PROBNP in the last 8760 hours. HbA1C: No results for input(s): HGBA1C in the last 72 hours. CBG: No results for input(s): GLUCAP in the last 168 hours. Lipid Profile: No results for input(s): CHOL, HDL, LDLCALC, TRIG, CHOLHDL, LDLDIRECT in the  last 72 hours. Thyroid  Function Tests: No results for input(s): TSH, T4TOTAL, FREET4, T3FREE, THYROIDAB in the last 72 hours. Anemia Panel: No results for input(s): VITAMINB12, FOLATE, FERRITIN, TIBC, IRON, RETICCTPCT in the last 72 hours. Sepsis Labs: No results for input(s): PROCALCITON, LATICACIDVEN in the last 168 hours.  No results found for this or any previous visit (from the past 240 hours).       Radiology Studies: MR BRAIN WO CONTRAST Result Date: 03/04/2024 CLINICAL DATA:  Provided history: Altered mental status, nontraumatic. EXAM: MRI HEAD WITHOUT CONTRAST TECHNIQUE: Multiplanar, multiecho pulse sequences of the brain and surrounding structures were obtained without intravenous contrast. COMPARISON:  Head CT 11/19/2023. FINDINGS: Brain: No age-advanced or lobar predominant cerebral atrophy. Small acute/subacute infarct within the left corona radiata (series 3, images 30 and 31). Multifocal T2 FLAIR hyperintense signal abnormality elsewhere within the cerebral white matter, nonspecific but compatible with mild chronic small vessel ischemic disease. No cortical encephalomalacia is identified. No evidence of an intracranial mass. No chronic intracranial blood products. No extra-axial fluid collection. No midline shift. Vascular: Maintained flow voids within the proximal large arterial vessels. Skull and upper cervical spine: No focal worrisome marrow lesion. Sinuses/Orbits: No mass or acute finding within the imaged orbits. No significant paranasal sinus disease. Impression #1 will be called to the ordering clinician or representative by the Radiologist Assistant, and communication documented in the PACS or [employer]. IMPRESSION: 1. 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 2. Background mild cerebral white matter chronic small vessel ischemic disease. Electronically Signed   By: Rockey Childs D.O.   On: 03/04/2024 09:58   DG Chest Port 1  View Result Date: 03/02/2024 EXAM: 1 VIEW(S) XRAY OF THE CHEST 03/02/2024 10:04:32 PM COMPARISON: None available. CLINICAL HISTORY: CP. Intermittent chest pain x 11 days. Hx HTN. FINDINGS: LUNGS AND PLEURA: No focal pulmonary opacity. No pulmonary edema. No pleural effusion. No pneumothorax. HEART AND MEDIASTINUM: No acute abnormality of the cardiac and mediastinal silhouettes. BONES AND SOFT TISSUES: Multilevel thoracic osteophytosis. IMPRESSION: 1. No acute cardiopulmonary pathology related to the clinical history of intermittent chest pain. Electronically signed by: Norman Gatlin MD 03/02/2024 10:13 PM EDT RP Workstation: [id number]        Scheduled Meds:  atorvastatin  20 mg Oral Daily   Chlorhexidine Gluconate Cloth  6 each Topical Daily   diltiazem  180 mg Oral Daily   hydrochlorothiazide   25 mg Oral Daily   metoprolol succinate  25 mg Oral Daily   Continuous Infusions:  heparin 850 Units/hr (03/03/24 2149)     LOS: 0 days    Almarie KANDICE Hoots, MD  03/04/2024, 2:31 PM

## 2024-03-04 NOTE — Care Management Obs Status (Signed)
 MEDICARE OBSERVATION STATUS NOTIFICATION   Patient Details  Name: Cristina Quinn MRN: [id number] Date of Birth: 31-Oct-1955   Medicare Observation Status Notification Given:  Yes    Vonzell Arrie Sharps 03/04/2024, 8:42 AM

## 2024-03-04 NOTE — Progress Notes (Signed)
 ANTICOAGULATION CONSULT NOTE  Pharmacy Consult for Heparin Indication: radial artery thrombosis  No Known Allergies  Patient Measurements: Height: 5' 4 (162.6 cm) Weight: 87 kg (191 lb 12.8 oz) IBW/kg (Calculated) : 54.7 Heparin Dosing Weight: 74.1 kg  Vital Signs: Temp: 97.5 F (36.4 C) (10/01 0252) Temp Source: Oral (10/01 0252) BP: 96/63 (10/01 0252) Pulse Rate: 53 (10/01 0252)  Labs: Recent Labs    03/02/24 1839 03/02/24 2149 03/03/24 0636 03/03/24 1821 03/04/24 0344  HGB 10.6*  --   --   --  10.0*  HCT 33.4*  --   --   --  31.9*  PLT 384  --   --   --  347  APTT  --  26  --   --   --   LABPROT  --  13.7  --   --   --   INR  --  1.0  --   --   --   HEPARINUNFRC  --   --  >1.10* >1.10* 0.59  CREATININE 0.85  --   --   --   --     Estimated Creatinine Clearance: 67.6 mL/min (by C-G formula based on SCr of 0.85 mg/dL).   Assessment: 54 yof presenting with right arm pain following recent cardiac catheterization. 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  AM: Heparin level now at goal on 850 units/hr.  No bleeding observed per RN. CBC stable  Goal of Therapy:  Heparin level 0.3-0.7 units/ml Monitor platelets by anticoagulation protocol: Yes   Plan:  Continue IV heparin at 850 units/hr Confirmatory heparin level in 6 hours CBC daily  Lynwood Poplar, PharmD, BCPS Clinical Pharmacist 03/04/2024 4:39 AM

## 2024-03-05 ENCOUNTER — Observation Stay (HOSPITAL_BASED_OUTPATIENT_CLINIC_OR_DEPARTMENT_OTHER)

## 2024-03-05 DIAGNOSIS — I70208 Unspecified atherosclerosis of native arteries of extremities, other extremity: Secondary | ICD-10-CM | POA: Diagnosis not present

## 2024-03-05 DIAGNOSIS — E785 Hyperlipidemia, unspecified: Secondary | ICD-10-CM

## 2024-03-05 DIAGNOSIS — I4891 Unspecified atrial fibrillation: Secondary | ICD-10-CM | POA: Diagnosis not present

## 2024-03-05 DIAGNOSIS — I6389 Other cerebral infarction: Secondary | ICD-10-CM | POA: Diagnosis not present

## 2024-03-05 LAB — CBC
HCT: 32.2 % — ABNORMAL LOW (ref 36.0–46.0)
Hemoglobin: 10.1 g/dL — ABNORMAL LOW (ref 12.0–15.0)
MCH: 23.4 pg — ABNORMAL LOW (ref 26.0–34.0)
MCHC: 31.4 g/dL (ref 30.0–36.0)
MCV: 74.5 fL — ABNORMAL LOW (ref 80.0–100.0)
Platelets: 337 K/uL (ref 150–400)
RBC: 4.32 MIL/uL (ref 3.87–5.11)
RDW: 14.9 % (ref 11.5–15.5)
WBC: 10.6 K/uL — ABNORMAL HIGH (ref 4.0–10.5)
nRBC: 0 % (ref 0.0–0.2)

## 2024-03-05 LAB — ECHOCARDIOGRAM COMPLETE
Area-P 1/2: 3.85 cm2
Height: 64 in
S' Lateral: 3.3 cm
Weight: 3068.8 [oz_av]

## 2024-03-05 LAB — LIPID PANEL
Cholesterol: 166 mg/dL (ref 0–200)
HDL: 72 mg/dL (ref >40)
LDL Cholesterol: 82 mg/dL (ref 0–99)
Total CHOL/HDL Ratio: 2.3 ratio
Triglycerides: 59 mg/dL (ref <150)
VLDL: 12 mg/dL (ref 0–40)

## 2024-03-05 LAB — HEPARIN LEVEL (UNFRACTIONATED): Heparin Unfractionated: 0.42 [IU]/mL (ref 0.30–0.70)

## 2024-03-05 MED ORDER — ATORVASTATIN CALCIUM 40 MG PO TABS
40.0000 mg | ORAL_TABLET | Freq: Every day | ORAL | Status: DC
  Administered 2024-03-06: 40 mg via ORAL
  Filled 2024-03-05: qty 1

## 2024-03-05 MED ORDER — APIXABAN 5 MG PO TABS
5.0000 mg | ORAL_TABLET | Freq: Two times a day (BID) | ORAL | Status: DC
  Administered 2024-03-05 – 2024-03-06 (×3): 5 mg via ORAL
  Filled 2024-03-05 (×3): qty 1

## 2024-03-05 MED ORDER — STROKE: EARLY STAGES OF RECOVERY BOOK
Freq: Once | Status: AC
  Filled 2024-03-05: qty 1

## 2024-03-05 NOTE — Discharge Instructions (Signed)

## 2024-03-05 NOTE — Progress Notes (Addendum)
 ANTICOAGULATION CONSULT NOTE  Pharmacy Consult for Heparin Indication: radial artery thrombosis  No Known Allergies  Patient Measurements: Height: 5' 4 (162.6 cm) Weight: 87 kg (191 lb 12.8 oz) IBW/kg (Calculated) : 54.7 Heparin Dosing Weight: 74.1 kg  Vital Signs: Temp: 98.2 F (36.8 C) (10/02 0406) Temp Source: Oral (10/02 0406) BP: 116/60 (10/02 0406)  Labs: Recent Labs    03/02/24 1839 03/02/24 2149 03/03/24 0636 03/04/24 0344 03/04/24 0953 03/05/24 0332  HGB 10.6*  --   --  10.0*  --  10.1*  HCT 33.4*  --   --  31.9*  --  32.2*  PLT 384  --   --  347  --  337  APTT  --  26  --   --   --   --   LABPROT  --  13.7  --   --   --   --   INR  --  1.0  --   --   --   --   HEPARINUNFRC  --   --    < > 0.59 0.61 0.42  CREATININE 0.85  --   --   --   --   --    < > = values in this interval not displayed.   Estimated Creatinine Clearance: 67.6 mL/min (by C-G formula based on SCr of 0.85 mg/dL).  Assessment: 10 yof presenting with right arm pain following recent cardiac catheterization. 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  Heparin level now at goal x2 on 850 units/hr.  No bleeding observed per RN. CBC stable  Goal of Therapy:  Heparin level 0.3-0.7 units/ml Monitor platelets by anticoagulation protocol: Yes   Plan:  Continue heparin infusion at 850 units/hr Check heparin level daily while on heparin Continue to monitor H&H and platelets F/u transition to DOAC   Addendum: Ok to transition to DOAC  Stop IV heparin Start apixaban 5mg  PO BID  Of note, test claim for Eliquis 5 mg and the current 30 day co-pay is $456.62 due to a deductible. Copay will be $47.00 once deductible is met.  Thank you for allowing pharmacy to be a part of this patient's care.  Shelba Collier, PharmD, BCPS Clinical Pharmacist

## 2024-03-05 NOTE — Evaluation (Signed)
 Physical Therapy Evaluation and Discharge Patient Details Name: Cristina Quinn MRN: [id number] DOB: 11/10/1955 Today's Date: 03/05/2024  History of Present Illness  68 y.o. female presented 03/03/24 with R wrist pain and found to have radial artery occlusion for which Cards recommened IP admission for heparin. Pt mildly confused; MRI was obtained and revealed a small acute/subacute infarct within the left corona radiata. PMH significant of paroxysmal Afib (diagnosed but unable to afford and has not started anti-coagulant), and HTN  Clinical Impression   Patient evaluated by Physical Therapy with no further acute PT needs identified. Patient performed all mobility independently without DME. Ambulated 400 ft without issue. PT is signing off. Thank you for this referral.         If plan is discharge home, recommend the following:     Can travel by private vehicle        Equipment Recommendations None recommended by PT  Recommendations for Other Services       Functional Status Assessment Patient has not had a recent decline in their functional status     Precautions / Restrictions Precautions Precautions: None      Mobility  Bed Mobility Overal bed mobility: Independent                  Transfers Overall transfer level: Independent Equipment used: None                    Ambulation/Gait Ambulation/Gait assistance: Independent Gait Distance (Feet): 400 Feet Assistive device: None Gait Pattern/deviations: WFL(Within Functional Limits)   Gait velocity interpretation: >2.62 ft/sec, indicative of community ambulatory   General Gait Details: including head turns and changes in velocity  Stairs            Wheelchair Mobility     Tilt Bed    Modified Rankin (Stroke Patients Only) Modified Rankin (Stroke Patients Only) Pre-Morbid Rankin Score: No symptoms Modified Rankin: No symptoms     Balance Overall balance assessment: Independent                                            Pertinent Vitals/Pain Pain Assessment Pain Assessment: No/denies pain    Home Living Family/patient expects to be discharged to:: Private residence Living Arrangements: Spouse/significant other Available Help at Discharge: Available 24 hours/day Type of Home: House Home Access: Stairs to enter Entrance Stairs-Rails: Right Entrance Stairs-Number of Steps: 2 Alternate Level Stairs-Number of Steps: 4+4 Home Layout: Multi-level Home Equipment: None      Prior Function Prior Level of Function : Independent/Modified Independent;Driving                     Extremity/Trunk Assessment   Upper Extremity Assessment Upper Extremity Assessment: Overall WFL for tasks assessed;Right hand dominant    Lower Extremity Assessment Lower Extremity Assessment: Overall WFL for tasks assessed    Cervical / Trunk Assessment Cervical / Trunk Assessment: Normal  Communication   Communication Communication: No apparent difficulties    Cognition Arousal: Alert Behavior During Therapy: WFL for tasks assessed/performed   PT - Cognitive impairments: No apparent impairments                         Following commands: Intact       Cueing Cueing Techniques: Verbal cues     General Comments General comments (skin  integrity, edema, etc.): VSS per telemetry    Exercises     Assessment/Plan    PT Assessment Patient does not need any further PT services  PT Problem List         PT Treatment Interventions      PT Goals (Current goals can be found in the Care Plan section)  Acute Rehab PT Goals PT Goal Formulation: All assessment and education complete, DC therapy    Frequency       Co-evaluation               AM-PAC PT 6 Clicks Mobility  Outcome Measure Help needed turning from your back to your side while in a flat bed without using bedrails?: None Help needed moving from lying on your back to  sitting on the side of a flat bed without using bedrails?: None Help needed moving to and from a bed to a chair (including a wheelchair)?: None Help needed standing up from a chair using your arms (e.g., wheelchair or bedside chair)?: None Help needed to walk in hospital room?: None Help needed climbing 3-5 steps with a railing? : None 6 Click Score: 24    End of Session   Activity Tolerance: Patient tolerated treatment well Patient left: in chair;with call bell/phone within reach Nurse Communication: Mobility status;Other (comment) (no PT or DME needs) PT Visit Diagnosis: Other symptoms and signs involving the nervous system (R29.898)    Time: 8875-8858 PT Time Calculation (min) (ACUTE ONLY): 17 min   Charges:   PT Evaluation $PT Eval Low Complexity: 1 Low   PT General Charges $$ ACUTE PT VISIT: 1 Visit          Macario RAMAN, PT Acute Rehabilitation Services  Office [phone]   Macario SHAUNNA Soja 03/05/2024, 11:50 AM

## 2024-03-05 NOTE — Progress Notes (Signed)
 OT Cancellation Note  Patient Details Name: Cristina Quinn MRN: [id number] DOB: 1955/08/06   Cancelled Treatment:    Reason Eval/Treat Not Completed: OT screened, no needs identified, will sign off. Per secure chat with PT, pt ind. Denies any changes in R hand. No OT related concerns, OT is signing off on this pt.   Aldora Perman C, OT  Acute Rehabilitation Services Office [phone] Secure chat preferred   Adrianne GORMAN Savers 03/05/2024, 11:58 AM

## 2024-03-05 NOTE — Progress Notes (Signed)
 STROKE TEAM PROGRESS NOTE   SUBJECTIVE (INTERVAL HISTORY) No family is at the bedside.  Overall her condition is completely resolved.  Patient stated that her confusion has resolved and that likely from sleep deprivation.  Right arm pain also resolved except some soreness left.  Heparin IV switched to Eliquis today.   OBJECTIVE Temp:  [97.5 F (36.4 C)-98.2 F (36.8 C)] 97.7 F (36.5 C) (10/02 1205) Pulse Rate:  [60-67] 67 (10/02 1205) Cardiac Rhythm: Sinus bradycardia (10/02 0805) Resp:  [15-20] 17 (10/02 1205) BP: (95-118)/(58-66) 95/58 (10/02 1205) SpO2:  [100 %] 100 % (10/02 1205)  No results for input(s): GLUCAP in the last 168 hours. Recent Labs  Lab 03/02/24 1839  NA 140  K 3.9  CL 103  CO2 25  GLUCOSE 92  BUN 16  CREATININE 0.85  CALCIUM 9.5   No results for input(s): AST, ALT, ALKPHOS, BILITOT, PROT, ALBUMIN in the last 168 hours. Recent Labs  Lab 03/02/24 1839 03/04/24 0344 03/05/24 0332  WBC 9.7 9.2 10.6*  HGB 10.6* 10.0* 10.1*  HCT 33.4* 31.9* 32.2*  MCV 74.9* 74.7* 74.5*  PLT 384 347 337   No results for input(s): CKTOTAL, CKMB, CKMBINDEX, TROPONINI in the last 168 hours. Recent Labs    03/02/24 2149  LABPROT 13.7  INR 1.0   No results for input(s): COLORURINE, LABSPEC, PHURINE, GLUCOSEU, HGBUR, BILIRUBINUR, KETONESUR, PROTEINUR, UROBILINOGEN, NITRITE, LEUKOCYTESUR in the last 72 hours.  Invalid input(s): APPERANCEUR     Component Value Date/Time   CHOL 166 03/05/2024 0332   TRIG 59 03/05/2024 0332   HDL 72 03/05/2024 0332   CHOLHDL 2.3 03/05/2024 0332   VLDL 12 03/05/2024 0332   LDLCALC 82 03/05/2024 0332   Lab Results  Component Value Date   HGBA1C 5.1 03/04/2024   No results found for: LABOPIA, COCAINSCRNUR, LABBENZ, AMPHETMU, THCU, LABBARB  No results for input(s): ETH in the last 168 hours.  I have personally reviewed the radiological images below and agree with the  radiology interpretations.  ECHOCARDIOGRAM COMPLETE Result Date: 03/05/2024    ECHOCARDIOGRAM REPORT   Patient Name:   Cristina Quinn Date of Exam: 03/05/2024 Medical Rec #:  [id number]     Height:       64.0 in Accession #:    [id number]    Weight:       191.8 lb Date of Birth:  04-09-1956     BSA:          1.922 m Patient Age:    68 years      BP:           114/66 mmHg Patient Gender: F             HR:           54 bpm. Exam Location:  Inpatient Procedure: 2D Echo, Cardiac Doppler, Color Doppler and Strain Analysis (Both            Spectral and Color Flow Doppler were utilized during procedure). Indications:    Stroke  History:        Patient has no prior history of Echocardiogram examinations.  Sonographer:    Philomena Daring Referring Phys: ALMARIE MATSU MATHEWS  Sonographer Comments: Global longitudinal strain was attempted. IMPRESSIONS  1. Left ventricular ejection fraction, by estimation, is 60 to 65%. Left ventricular ejection fraction by PLAX is 63 %. The left ventricle has normal function. The left ventricle has no regional wall motion abnormalities. There is mild left ventricular hypertrophy. Left ventricular  diastolic parameters are consistent with Grade I diastolic dysfunction (impaired relaxation). The average left ventricular global longitudinal strain is -25.3 %. The global longitudinal strain is normal.  2. Right ventricular systolic function is normal. The right ventricular size is normal. There is normal pulmonary artery systolic pressure.  3. The mitral valve is grossly normal. Trivial mitral valve regurgitation.  4. The aortic valve is tricuspid. Aortic valve regurgitation is not visualized.  5. The inferior vena cava is normal in size with greater than 50% respiratory variability, suggesting right atrial pressure of 3 mmHg. Comparison(s): No prior Echocardiogram. FINDINGS  Left Ventricle: Left ventricular ejection fraction, by estimation, is 60 to 65%. Left ventricular ejection fraction by PLAX is  63 %. The left ventricle has normal function. The left ventricle has no regional wall motion abnormalities. The average left ventricular global longitudinal strain is -25.3 %. Strain was performed and the global longitudinal strain is normal. The left ventricular internal cavity size was normal in size. There is mild left ventricular hypertrophy. Left ventricular diastolic parameters are consistent with Grade I diastolic dysfunction (impaired relaxation). Indeterminate filling pressures. Right Ventricle: The right ventricular size is normal. No increase in right ventricular wall thickness. Right ventricular systolic function is normal. There is normal pulmonary artery systolic pressure. The tricuspid regurgitant velocity is 2.08 m/s, and  with an assumed right atrial pressure of 3 mmHg, the estimated right ventricular systolic pressure is 20.3 mmHg. Left Atrium: Left atrial size was normal in size. Right Atrium: Right atrial size was normal in size. Pericardium: There is no evidence of pericardial effusion. Mitral Valve: The mitral valve is grossly normal. Trivial mitral valve regurgitation. Tricuspid Valve: The tricuspid valve is grossly normal. Tricuspid valve regurgitation is trivial. Aortic Valve: The aortic valve is tricuspid. Aortic valve regurgitation is not visualized. Pulmonic Valve: The pulmonic valve was normal in structure. Pulmonic valve regurgitation is not visualized. Aorta: The aortic root and ascending aorta are structurally normal, with no evidence of dilitation. Venous: The inferior vena cava is normal in size with greater than 50% respiratory variability, suggesting right atrial pressure of 3 mmHg. IAS/Shunts: No atrial level shunt detected by color flow Doppler.  LEFT VENTRICLE PLAX 2D LV EF:         Left            Diastology                ventricular     LV e' medial:    8.16 cm/s                ejection        LV E/e' medial:  10.1                fraction by     LV e' lateral:   8.49 cm/s                 PLAX is 63      LV E/e' lateral: 9.8                %. LVIDd:         5.00 cm         2D Longitudinal LVIDs:         3.30 cm         Strain LV PW:         1.00 cm         2D Strain GLS   -24.6 % LV IVS:  1.10 cm         (A4C): LVOT diam:     2.10 cm         2D Strain GLS   -21.7 % LV SV:         100             (A3C): LV SV Index:   52              2D Strain GLS   -29.6 % LVOT Area:     3.46 cm        (A2C):                                2D Strain GLS   -25.3 %                                Avg: RIGHT VENTRICLE             IVC RV S prime:     17.10 cm/s  IVC diam: 1.20 cm TAPSE (M-mode): 2.7 cm LEFT ATRIUM           Index        RIGHT ATRIUM           Index LA diam:      3.20 cm 1.67 cm/m   RA Area:     13.70 cm LA Vol (A2C): 47.9 ml 24.92 ml/m  RA Volume:   31.40 ml  16.34 ml/m LA Vol (A4C): 44.4 ml 23.10 ml/m  AORTIC VALVE LVOT Vmax:   121.00 cm/s LVOT Vmean:  76.500 cm/s LVOT VTI:    0.289 m  AORTA Ao Root diam: 2.90 cm Ao Asc diam:  3.00 cm MITRAL VALVE                TRICUSPID VALVE MV Area (PHT): 3.85 cm     TR Peak grad:   17.3 mmHg MV Decel Time: 197 msec     TR Vmax:        208.00 cm/s MV E velocity: 82.80 cm/s MV A velocity: 106.00 cm/s  SHUNTS MV E/A ratio:  0.78         Systemic VTI:  0.29 m                             Systemic Diam: 2.10 cm Vinie Maxcy MD Electronically signed by Vinie Maxcy MD Signature Date/Time: 03/05/2024/11:14:34 AM    Final    CT ANGIO HEAD NECK W WO CM Result Date: 03/04/2024 EXAM: CTA HEAD AND NECK WITHOUT AND WITH 03/04/2024 05:59:58 PM TECHNIQUE: CTA of the head and neck was performed without and with the administration of 75 mL of iohexol (OMNIPAQUE) 350 MG/ML injection. Multiplanar 2D and/or 3D reformatted images are provided for review. Automated exposure control, iterative reconstruction, and/or weight based adjustment of the mA/kV was utilized to reduce the radiation dose to as low as reasonably achievable. Stenosis of the internal carotid  arteries measured using NASCET criteria. COMPARISON: None available CLINICAL HISTORY: Transient ischemic attack (TIA). FINDINGS: CTA NECK: AORTIC ARCH AND ARCH VESSELS: No dissection or arterial injury. No significant stenosis of the brachiocephalic or subclavian arteries. CERVICAL CAROTID ARTERIES: Mesenteric mixed density atherosclerotic disease of the proximal left ICA without hemodynamically significant stenosis. The right ICA is patent without significant stenosis or dissection.  No dissection or arterial injury. CERVICAL VERTEBRAL ARTERIES: No dissection, arterial injury, or significant stenosis. LUNGS AND MEDIASTINUM: Unremarkable. SOFT TISSUES: No acute abnormality. BONES: No acute abnormality. CTA HEAD: ANTERIOR CIRCULATION: No significant stenosis of the internal carotid arteries. No significant stenosis of the anterior cerebral arteries. No significant stenosis of the middle cerebral arteries. No aneurysm. POSTERIOR CIRCULATION: No significant stenosis of the posterior cerebral arteries. No significant stenosis of the basilar artery. No significant stenosis of the vertebral arteries. No aneurysm. OTHER: No dural venous sinus thrombosis on this non-dedicated study. IMPRESSION: 1. Atherosclerotic disease of the proximal left internal carotid artery without hemodynamically significant stenosis. 2. No large vessel occlusion, hemodynamically significant stenosis, or aneurysm in the head or neck. Electronically signed by: Franky Stanford MD 03/04/2024 11:16 PM EDT RP Workstation: [id number]   MR BRAIN WO CONTRAST Result Date: 03/04/2024 CLINICAL DATA:  Provided history: Altered mental status, nontraumatic. EXAM: MRI HEAD WITHOUT CONTRAST TECHNIQUE: Multiplanar, multiecho pulse sequences of the brain and surrounding structures were obtained without intravenous contrast. COMPARISON:  Head CT 11/19/2023. FINDINGS: Brain: No age-advanced or lobar predominant cerebral atrophy. Small acute/subacute infarct within the  left corona radiata (series 3, images 30 and 31). Multifocal T2 FLAIR hyperintense signal abnormality elsewhere within the cerebral white matter, nonspecific but compatible with mild chronic small vessel ischemic disease. No cortical encephalomalacia is identified. No evidence of an intracranial mass. No chronic intracranial blood products. No extra-axial fluid collection. No midline shift. Vascular: Maintained flow voids within the proximal large arterial vessels. Skull and upper cervical spine: No focal worrisome marrow lesion. Sinuses/Orbits: No mass or acute finding within the imaged orbits. No significant paranasal sinus disease. Impression #1 will be called to the ordering clinician or representative by the Radiologist Assistant, and communication documented in the PACS or [employer]. IMPRESSION: 1. 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 2. Background mild cerebral white matter chronic small vessel ischemic disease. Electronically Signed   By: Rockey Childs D.O.   On: 03/04/2024 09:58   DG Chest Port 1 View Result Date: 03/02/2024 EXAM: 1 VIEW(S) XRAY OF THE CHEST 03/02/2024 10:04:32 PM COMPARISON: None available. CLINICAL HISTORY: CP. Intermittent chest pain x 11 days. Hx HTN. FINDINGS: LUNGS AND PLEURA: No focal pulmonary opacity. No pulmonary edema. No pleural effusion. No pneumothorax. HEART AND MEDIASTINUM: No acute abnormality of the cardiac and mediastinal silhouettes. BONES AND SOFT TISSUES: Multilevel thoracic osteophytosis. IMPRESSION: 1. No acute cardiopulmonary pathology related to the clinical history of intermittent chest pain. Electronically signed by: Norman Gatlin MD 03/02/2024 10:13 PM EDT RP Workstation: [id number]     PHYSICAL EXAM  Temp:  [97.5 F (36.4 C)-98.2 F (36.8 C)] 97.7 F (36.5 C) (10/02 1205) Pulse Rate:  [60-67] 67 (10/02 1205) Resp:  [15-20] 17 (10/02 1205) BP: (95-118)/(58-66) 95/58 (10/02 1205) SpO2:  [100 %] 100 % (10/02  1205)  General - Well nourished, well developed, in no apparent distress.  Ophthalmologic - fundi not visualized due to noncooperation.  Cardiovascular - Regular rhythm and rate.  Mental Status -  Level of arousal and orientation to time, place, and person were intact. Language including expression, naming, repetition, comprehension was assessed and found intact. Attention span and concentration were normal. Fund of Knowledge was assessed and was intact.  Cranial Nerves II - XII - II - Visual field intact OU. III, IV, VI - Extraocular movements intact. V - Facial sensation intact bilaterally. VII - Facial movement intact bilaterally. VIII - Hearing & vestibular intact bilaterally. X -  Palate elevates symmetrically. XI - Chin turning & shoulder shrug intact bilaterally. XII - Tongue protrusion intact.  Motor Strength - The patient's strength was normal in all extremities and pronator drift was absent.  Bulk was normal and fasciculations were absent.   Motor Tone - Muscle tone was assessed at the neck and appendages and was normal.  Reflexes - The patient's reflexes were symmetrical in all extremities and she had no pathological reflexes.  Sensory - Light touch, temperature/pinprick were assessed and were symmetrical.    Coordination - The patient had normal movements in the hands and feet with no ataxia or dysmetria.  Tremor was absent.  Gait and Station - deferred.   ASSESSMENT/PLAN Ms. Ericha Whittingham is a 67 y.o. female with history of hypertension, recent A-fib not started Eliquis yet admitted for chest pain right arm pain with right radial artery occlusion.  Found to have episode of confusion.  MRI shows small infarct.  No TNK given due to symptom resolved.    Stroke:  left CR small infarct, likely due to small stroke disease given location, however due to recent A-fib not on AC, cannot rule out cardioembolic source  MRI subacute small left CR infarct CT head and neck  unremarkable 2D Echo EF 60 to 65% LDL 82 HgbA1c 5.1 Eliquis for VTE prophylaxis No antithrombotic prior to admission, now on heparin IV switch to Eliquis Patient counseled to be compliant with her antithrombotic medications Ongoing aggressive stroke risk factor management Therapy recommendations: None Disposition: Pending  New diagnosis of Afib Recent diagnosis Suppose to be on eliquis, but did not start yet Now on eliquis Continue on discharge  R radial A occlusion right arm pain days after cardiac cath via R radial A Outpt US  showed right radial artery occlusion Symptoms improved and near resolved VVS on board No surgery indicated Continue eliquis  Hypertension Stable on the low end She is on metoprolol, cardizem and hydrochlorothiazide  now Discussed with primary team, may consider adjusting the BP meds Long term BP goal normotensive  Hyperlipidemia Home meds: Lipitor 20 LDL 82, goal < 70 Now on Lipitor 40 Continue statin at discharge  Other Stroke Risk Factors Advanced age Obesity, Body mass index is 32.92 kg/m.   Other Active Problems Mild leukocytosis WBC 9.2-10.6  Hospital day # 0  Neurology will sign off. Please call with questions. Pt will follow up with stroke clinic NP at [facility] in about 4 weeks. Thanks for the consult.   Ary Cummins, MD PhD Stroke Neurology 03/05/2024 2:18 PM    To contact Stroke Continuity provider, please refer to [email or web address]. After hours, contact General Neurology

## 2024-03-05 NOTE — Plan of Care (Signed)
   Problem: Clinical Measurements: Goal: Will remain free from infection Outcome: Progressing Goal: Diagnostic test results will improve Outcome: Progressing

## 2024-03-05 NOTE — Progress Notes (Signed)
 PROGRESS NOTE    Cristina Quinn  FMW:[id number] DOB: 1956-04-17 DOA: 03/02/2024 PCP: Siganporia, Arnaz, FNP   Brief Narrative:  68 y.o. female with medical history significant of paroxysmal Afib (dx at [facility] on 9/22 and started on Eliquis but pt unable to afford and has not started), and HTN p/w R wrist pain and found to have radial artery occlusion for which Cards recommened IP admission for hep gtt.  the patient underwent a cardiac catheterization for known arrhythmia on 9/18, which demonstrated nl coronary arteries, and had subsequent R wrist pain. She contacted OP cards team and Dr. Leda at [facility] ordered arterial duplex on 9/25, which demonstrated occlusion of the right radial artery from the proximal mid forearm to the distal forearm segment per EDP. Of note, pt was prescribed Eliquis for known Afib on 9/22 at [facility] but has been unable to pick up this medication due to cost.  Assessment & Plan:   Principal Problem:   Radial artery occlusion, right  R radial artery occlusion was on heparin drip.  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  Patient seen by neurology PT OT.  She passed bedside swallow evaluation.  Her A1c is 5.5 LDL is 82 normal TSH.   Echocardiogram ejection fraction 60 to 65%.  No regional wall motion abnormalities.  Grade 1 diastolic dysfunction. CT angiogram of the head and neck showed atherosclerotic disease of the proximal left internal carotid artery without hemodynamically significant stenosis.  No large vessel occlusion. Continue Eliquis.  Afib CHADsVASc 3 rate is controlled.  Patient was on metoprolol and diltiazem but her blood pressure has been running soft stopped metoprolol continue diltiazem.  Continue Eliquis.   HTN blood pressure soft stopped HCTZ and metoprolol.   HLD -PTA  atorvastatin  Estimated body mass index is 32.92 kg/m as calculated from the following:   Height as of this encounter: 5' 4 (1.626 m).   Weight as of this encounter: 87 kg.  DVT prophylaxis Eliquis  code Status: full Family Communication: none Disposition Plan: Home when ready Consultants:  NEURO VASCULAR  Procedures: mri Antimicrobials:none  Subjective: She is awake she walked with physical therapy denied dizziness denies any pain in her right upper extremity she feels her right upper extremity feels tight   Objective: Vitals:   03/05/24 0406 03/05/24 0808 03/05/24 0959 03/05/24 1205  BP: 116/60 114/66  (!) 95/58  Pulse:  60  67  Resp: 16 18 20 17   Temp: 98.2 F (36.8 C) 98.1 F (36.7 C)  97.7 F (36.5 C)  TempSrc: Oral Oral  Oral  SpO2:  100%  100%  Weight:      Height:        Intake/Output Summary (Last 24 hours) at 03/05/2024 1452 Last data filed at 03/05/2024 0135 Gross per 24 hour  Intake 48.78 ml  Output --  Net 48.78 ml   Filed Weights   03/02/24 2314 03/04/24 0252  Weight: 87.4 kg 87 kg    Examination:  General exam: Appears calm and comfortable  Respiratory system: Clear to auscultation. Respiratory effort normal. Cardiovascular system: irreg irreg Gastrointestinal system:  soft nt Central nervous system: Alert and oriented. Extremities: no edema   Data Reviewed: I have personally reviewed following labs and imaging studies  CBC: Recent Labs  Lab 03/02/24 1839 03/04/24 0344 03/05/24 0332  WBC 9.7 9.2  10.6*  HGB 10.6* 10.0* 10.1*  HCT 33.4* 31.9* 32.2*  MCV 74.9* 74.7* 74.5*  PLT 384 347 337   Basic Metabolic Panel: Recent Labs  Lab 03/02/24 1839  NA 140  K 3.9  CL 103  CO2 25  GLUCOSE 92  BUN 16  CREATININE 0.85  CALCIUM 9.5   GFR: Estimated Creatinine Clearance: 67.6 mL/min (by C-G formula based on SCr of 0.85 mg/dL). Liver Function Tests: No results for input(s): AST, ALT, ALKPHOS, BILITOT, PROT, ALBUMIN  in the last 168 hours. No results for input(s): LIPASE, AMYLASE in the last 168 hours. No results for input(s): AMMONIA in the last 168 hours. Coagulation Profile: Recent Labs  Lab 03/02/24 2149  INR 1.0   Cardiac Enzymes: No results for input(s): CKTOTAL, CKMB, CKMBINDEX, TROPONINI in the last 168 hours. BNP (last 3 results) No results for input(s): PROBNP in the last 8760 hours. HbA1C: Recent Labs    03/04/24 0344  HGBA1C 5.1   CBG: No results for input(s): GLUCAP in the last 168 hours. Lipid Profile: Recent Labs    03/05/24 0332  CHOL 166  HDL 72  LDLCALC 82  TRIG 59  CHOLHDL 2.3   Thyroid  Function Tests: No results for input(s): TSH, T4TOTAL, FREET4, T3FREE, THYROIDAB in the last 72 hours. Anemia Panel: No results for input(s): VITAMINB12, FOLATE, FERRITIN, TIBC, IRON, RETICCTPCT in the last 72 hours. Sepsis Labs: No results for input(s): PROCALCITON, LATICACIDVEN in the last 168 hours.  No results found for this or any previous visit (from the past 240 hours).       Radiology Studies: ECHOCARDIOGRAM COMPLETE Result Date: 03/05/2024    ECHOCARDIOGRAM REPORT   Patient Name:   Cristina Quinn Date of Exam: 03/05/2024 Medical Rec #:  [id number]     Height:       64.0 in Accession #:    [id number]    Weight:       191.8 lb Date of Birth:  03-30-1956     BSA:          1.922 m Patient Age:    68 years      BP:           114/66 mmHg Patient Gender: F             HR:           54 bpm. Exam Location:  Inpatient Procedure: 2D Echo, Cardiac Doppler, Color Doppler and Strain Analysis (Both            Spectral and Color Flow Doppler were utilized during procedure). Indications:    Stroke  History:        Patient has no prior history of Echocardiogram examinations.  Sonographer:    Cristina Quinn Referring Phys: Cristina Quinn Cristina Quinn  Sonographer Comments: Global longitudinal strain was attempted. IMPRESSIONS  1. Left ventricular ejection  fraction, by estimation, is 60 to 65%. Left ventricular ejection fraction by PLAX is 63 %. The left ventricle has normal function. The left ventricle has no regional wall motion abnormalities. There is mild left ventricular hypertrophy. Left ventricular diastolic parameters are consistent with Grade I diastolic dysfunction (impaired relaxation). The average left ventricular global longitudinal strain is -25.3 %. The global longitudinal strain is normal.  2. Right ventricular systolic function is normal. The right ventricular size is normal. There is normal pulmonary artery systolic pressure.  3. The mitral valve is grossly normal. Trivial mitral valve regurgitation.  4. The aortic valve is tricuspid. Aortic valve regurgitation  is not visualized.  5. The inferior vena cava is normal in size with greater than 50% respiratory variability, suggesting right atrial pressure of 3 mmHg. Comparison(s): No prior Echocardiogram. FINDINGS  Left Ventricle: Left ventricular ejection fraction, by estimation, is 60 to 65%. Left ventricular ejection fraction by PLAX is 63 %. The left ventricle has normal function. The left ventricle has no regional wall motion abnormalities. The average left ventricular global longitudinal strain is -25.3 %. Strain was performed and the global longitudinal strain is normal. The left ventricular internal cavity size was normal in size. There is mild left ventricular hypertrophy. Left ventricular diastolic parameters are consistent with Grade I diastolic dysfunction (impaired relaxation). Indeterminate filling pressures. Right Ventricle: The right ventricular size is normal. No increase in right ventricular wall thickness. Right ventricular systolic function is normal. There is normal pulmonary artery systolic pressure. The tricuspid regurgitant velocity is 2.08 m/s, and  with an assumed right atrial pressure of 3 mmHg, the estimated right ventricular systolic pressure is 20.3 mmHg. Left Atrium: Left  atrial size was normal in size. Right Atrium: Right atrial size was normal in size. Pericardium: There is no evidence of pericardial effusion. Mitral Valve: The mitral valve is grossly normal. Trivial mitral valve regurgitation. Tricuspid Valve: The tricuspid valve is grossly normal. Tricuspid valve regurgitation is trivial. Aortic Valve: The aortic valve is tricuspid. Aortic valve regurgitation is not visualized. Pulmonic Valve: The pulmonic valve was normal in structure. Pulmonic valve regurgitation is not visualized. Aorta: The aortic root and ascending aorta are structurally normal, with no evidence of dilitation. Venous: The inferior vena cava is normal in size with greater than 50% respiratory variability, suggesting right atrial pressure of 3 mmHg. IAS/Shunts: No atrial level shunt detected by color flow Doppler.  LEFT VENTRICLE PLAX 2D LV EF:         Left            Diastology                ventricular     LV e' medial:    8.16 cm/s                ejection        LV E/e' medial:  10.1                fraction by     LV e' lateral:   8.49 cm/s                PLAX is 63      LV E/e' lateral: 9.8                %. LVIDd:         5.00 cm         2D Longitudinal LVIDs:         3.30 cm         Strain LV PW:         1.00 cm         2D Strain GLS   -24.6 % LV IVS:        1.10 cm         (A4C): LVOT diam:     2.10 cm         2D Strain GLS   -21.7 % LV SV:         100             (A3C): LV SV Index:   52  2D Strain GLS   -29.6 % LVOT Area:     3.46 cm        (A2C):                                2D Strain GLS   -25.3 %                                Avg: RIGHT VENTRICLE             IVC RV S prime:     17.10 cm/s  IVC diam: 1.20 cm TAPSE (M-mode): 2.7 cm LEFT ATRIUM           Index        RIGHT ATRIUM           Index LA diam:      3.20 cm 1.67 cm/m   RA Area:     13.70 cm LA Vol (A2C): 47.9 ml 24.92 ml/m  RA Volume:   31.40 ml  16.34 ml/m LA Vol (A4C): 44.4 ml 23.10 ml/m  AORTIC VALVE LVOT Vmax:    121.00 cm/s LVOT Vmean:  76.500 cm/s LVOT VTI:    0.289 m  AORTA Ao Root diam: 2.90 cm Ao Asc diam:  3.00 cm MITRAL VALVE                TRICUSPID VALVE MV Area (PHT): 3.85 cm     TR Peak grad:   17.3 mmHg MV Decel Time: 197 msec     TR Vmax:        208.00 cm/s MV E velocity: 82.80 cm/s MV A velocity: 106.00 cm/s  SHUNTS MV E/A ratio:  0.78         Systemic VTI:  0.29 m                             Systemic Diam: 2.10 cm Vinie Maxcy MD Electronically signed by Vinie Maxcy MD Signature Date/Time: 03/05/2024/11:14:34 AM    Final    CT ANGIO HEAD NECK W WO CM Result Date: 03/04/2024 EXAM: CTA HEAD AND NECK WITHOUT AND WITH 03/04/2024 05:59:58 PM TECHNIQUE: CTA of the head and neck was performed without and with the administration of 75 mL of iohexol (OMNIPAQUE) 350 MG/ML injection. Multiplanar 2D and/or 3D reformatted images are provided for review. Automated exposure control, iterative reconstruction, and/or weight based adjustment of the mA/kV was utilized to reduce the radiation dose to as low as reasonably achievable. Stenosis of the internal carotid arteries measured using NASCET criteria. COMPARISON: None available CLINICAL HISTORY: Transient ischemic attack (TIA). FINDINGS: CTA NECK: AORTIC ARCH AND ARCH VESSELS: No dissection or arterial injury. No significant stenosis of the brachiocephalic or subclavian arteries. CERVICAL CAROTID ARTERIES: Mesenteric mixed density atherosclerotic disease of the proximal left ICA without hemodynamically significant stenosis. The right ICA is patent without significant stenosis or dissection. No dissection or arterial injury. CERVICAL VERTEBRAL ARTERIES: No dissection, arterial injury, or significant stenosis. LUNGS AND MEDIASTINUM: Unremarkable. SOFT TISSUES: No acute abnormality. BONES: No acute abnormality. CTA HEAD: ANTERIOR CIRCULATION: No significant stenosis of the internal carotid arteries. No significant stenosis of the anterior cerebral arteries. No significant  stenosis of the middle cerebral arteries. No aneurysm. POSTERIOR CIRCULATION: No significant stenosis of the posterior cerebral arteries. No significant stenosis of the basilar artery. No  significant stenosis of the vertebral arteries. No aneurysm. OTHER: No dural venous sinus thrombosis on this non-dedicated study. IMPRESSION: 1. Atherosclerotic disease of the proximal left internal carotid artery without hemodynamically significant stenosis. 2. No large vessel occlusion, hemodynamically significant stenosis, or aneurysm in the head or neck. Electronically signed by: Franky Stanford MD 03/04/2024 11:16 PM EDT RP Workstation: [id number]   MR BRAIN WO CONTRAST Result Date: 03/04/2024 CLINICAL DATA:  Provided history: Altered mental status, nontraumatic. EXAM: MRI HEAD WITHOUT CONTRAST TECHNIQUE: Multiplanar, multiecho pulse sequences of the brain and surrounding structures were obtained without intravenous contrast. COMPARISON:  Head CT 11/19/2023. FINDINGS: Brain: No age-advanced or lobar predominant cerebral atrophy. Small acute/subacute infarct within the left corona radiata (series 3, images 30 and 31). Multifocal T2 FLAIR hyperintense signal abnormality elsewhere within the cerebral white matter, nonspecific but compatible with mild chronic small vessel ischemic disease. No cortical encephalomalacia is identified. No evidence of an intracranial mass. No chronic intracranial blood products. No extra-axial fluid collection. No midline shift. Vascular: Maintained flow voids within the proximal large arterial vessels. Skull and upper cervical spine: No focal worrisome marrow lesion. Sinuses/Orbits: No mass or acute finding within the imaged orbits. No significant paranasal sinus disease. Impression #1 will be called to the ordering clinician or representative by the Radiologist Assistant, and communication documented in the PACS or [employer]. IMPRESSION: 1. Small acute/subacute infarct within the left corona  radiata. 2. Background mild cerebral white matter chronic small vessel ischemic disease. Electronically Signed   By: Rockey Childs D.O.   On: 03/04/2024 09:58        Scheduled Meds:  apixaban  5 mg Oral BID   atorvastatin  20 mg Oral Daily   diltiazem  180 mg Oral Daily   Continuous Infusions:     LOS: 0 days    Cristina KANDICE Hoots, MD  03/05/2024, 2:52 PM

## 2024-03-06 ENCOUNTER — Other Ambulatory Visit (HOSPITAL_COMMUNITY): Payer: Self-pay

## 2024-03-06 DIAGNOSIS — I70208 Unspecified atherosclerosis of native arteries of extremities, other extremity: Secondary | ICD-10-CM | POA: Diagnosis not present

## 2024-03-06 LAB — CBC
HCT: 32.3 % — ABNORMAL LOW (ref 36.0–46.0)
Hemoglobin: 10.3 g/dL — ABNORMAL LOW (ref 12.0–15.0)
MCH: 23.6 pg — ABNORMAL LOW (ref 26.0–34.0)
MCHC: 31.9 g/dL (ref 30.0–36.0)
MCV: 73.9 fL — ABNORMAL LOW (ref 80.0–100.0)
Platelets: 312 K/uL (ref 150–400)
RBC: 4.37 MIL/uL (ref 3.87–5.11)
RDW: 14.8 % (ref 11.5–15.5)
WBC: 9.6 K/uL (ref 4.0–10.5)
nRBC: 0 % (ref 0.0–0.2)

## 2024-03-06 MED ORDER — DILTIAZEM HCL ER COATED BEADS 120 MG PO CP24
120.0000 mg | ORAL_CAPSULE | Freq: Every day | ORAL | 4 refills | Status: AC
  Filled 2024-03-06: qty 30, 30d supply, fill #0

## 2024-03-06 MED ORDER — DILTIAZEM HCL ER COATED BEADS 120 MG PO CP24
120.0000 mg | ORAL_CAPSULE | Freq: Every day | ORAL | Status: DC
  Filled 2024-03-06: qty 1

## 2024-03-06 MED ORDER — ACETAMINOPHEN 325 MG PO TABS: 650.0000 mg | ORAL_TABLET | Freq: Four times a day (QID) | ORAL | Status: AC | PRN

## 2024-03-06 MED ORDER — APIXABAN 5 MG PO TABS
5.0000 mg | ORAL_TABLET | Freq: Two times a day (BID) | ORAL | 4 refills | Status: AC
  Filled 2024-03-06: qty 60, 30d supply, fill #0

## 2024-03-06 MED ORDER — ATORVASTATIN CALCIUM 40 MG PO TABS
40.0000 mg | ORAL_TABLET | Freq: Every day | ORAL | 4 refills | Status: DC
  Filled 2024-03-06: qty 30, 30d supply, fill #0

## 2024-03-06 NOTE — Progress Notes (Signed)
 Reviewed AVS, patient expressed understanding of medications, MD follow up reviewed.   Removed both IV, Site clean, dry and intact.  Patient states all belongings brought to the hospital at time of admission are accounted for and packed to take home.  Picked up medications from [facility] pharmacy. [facility] contacted to transport patient to Discharge lounge to wait for transportation home.

## 2024-03-06 NOTE — Discharge Summary (Signed)
 Physician Discharge Summary  Cristina Quinn FMW:[id number] DOB: [birth year] DOA: 03/02/2024  PCP: Siganporia, Arnaz, FNP  Admit date: 03/02/2024 Discharge date: 03/06/2024  Admitted From: Home  Disposition Home Recommendations for Outpatient Follow-up:  Follow up with PCP in 1-2 weeks Please obtain BMP/CBC in one week Please follow up with [facility]  Home Health: None  Equipment/Devices: None  Discharge Condition: Stable CODE STATUS: Full code Diet recommendation: Cardiac Brief/Interim Summary: 68 y.o. female with medical history significant of paroxysmal Afib (dx at [facility] on 9/22 and started on Eliquis but pt unable to afford and has not started), and HTN p/w R wrist pain and found to have radial artery occlusion for which Cards recommened IP admission for hep gtt.  the patient underwent a cardiac catheterization for known arrhythmia on 9/18, which demonstrated nl coronary arteries, and had subsequent R wrist pain. She contacted OP cards team and Dr. Leda at [facility] ordered arterial duplex on 9/25, which demonstrated occlusion of the right radial artery from the proximal mid forearm to the distal forearm segment per EDP. Of note, pt was prescribed Eliquis for known Afib on 9/22 at [facility] but has been unable to pick up this medication due to cost.  Discharge Diagnoses:  Principal Problem:   Radial artery occlusion, right    R radial artery occlusion-patient was treated with heparin drip during the hospital [facility].  She was seen by vascular surgery who recommended to continue anticoagulation with Eliquis on discharge.    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  Patient seen by neurology PT OT.  She passed bedside swallow evaluation.  Her A1c is 5.5 LDL is 82 normal TSH.   Echocardiogram ejection fraction 60 to 65%.  No regional wall motion abnormalities.  Grade 1 diastolic dysfunction. CT angiogram of the head and neck showed  atherosclerotic disease of the proximal left internal carotid artery without hemodynamically significant stenosis.  No large vessel occlusion. Continue Eliquis.  Follow-up with [facility] neurology.   Afib CHADsVASc 3 rate is controlled.  Patient was on metoprolol and diltiazem but her blood pressure has been running soft stopped metoprolol continue diltiazem.  Continue Eliquis.   HTN blood pressure soft stopped HCTZ and metoprolol and decrease the dose of diltiazem on discharge due to soft BP.   HLD -PTA atorvastatin   Estimated body mass index is 32.92 kg/m as calculated from the following:   Height as of this encounter: 5' 4 (1.626 m).   Weight as of this encounter: 87 kg.  Discharge Instructions  Discharge Instructions     Ambulatory referral to Neurology   Complete by: As directed    Follow up with stroke clinic NP at [facility] in about 4-6 weeks. Thanks.   Diet - low sodium heart healthy   Complete by: As directed    Increase activity slowly   Complete by: As directed       Allergies as of 03/06/2024   No Known Allergies      Medication List     STOP taking these medications    hydrochlorothiazide  25 MG tablet Commonly known as: HYDRODIURIL    meclizine 25 MG tablet Commonly known as: ANTIVERT   metoprolol succinate 25 MG 24 hr tablet Commonly known as: TOPROL-XL       TAKE these medications    acetaminophen 325 MG tablet Commonly known as: TYLENOL Take 2 tablets (650 mg total) by mouth every 6 (six) hours as needed for moderate pain (pain score 4-6).  atorvastatin 40 MG tablet Commonly known as: LIPITOR Take 1 tablet (40 mg total) by mouth daily. What changed:  medication strength how much to take   diltiazem 120 MG 24 hr capsule Commonly known as: CARDIZEM CD Take 1 capsule (120 mg total) by mouth daily. What changed:  medication strength how much to take   Eliquis 5 MG Tabs tablet Generic drug: apixaban Take 1 tablet (5 mg total) by mouth 2  (two) times daily.        Follow-up Information     [facility]. Schedule an appointment as soon as possible for a visit in 1 month(s).   Specialty: Neurology Why: stroke clinic Contact information: [address] [place]  [address] [phone]        Maryla Knoll, FNP Follow up.   Specialty: Nurse Practitioner Contact information: [address] [place] [address] [phone]                No Known Allergies  Consultations: Neurology   Procedures/Studies: ECHOCARDIOGRAM COMPLETE Result Date: 03/05/2024    ECHOCARDIOGRAM REPORT   Patient Name:   Cristina Quinn Date of Exam: 03/05/2024 Medical Rec #:  [id number]     Height:       64.0 in Accession #:    [id number]    Weight:       191.8 lb Date of Birth:  [birth year]     BSA:          1.922 m Patient Age:    68 years      BP:           114/66 mmHg Patient Gender: F             HR:           54 bpm. Exam Location:  Inpatient Procedure: 2D Echo, Cardiac Doppler, Color Doppler and Strain Analysis (Both            Spectral and Color Flow Doppler were utilized during procedure). Indications:    Stroke  History:        Patient has no prior history of Echocardiogram examinations.  Sonographer:    Philomena Daring Referring Phys: ALMARIE MATSU Sritha Chauncey  Sonographer Comments: Global longitudinal strain was attempted. IMPRESSIONS  1. Left ventricular ejection fraction, by estimation, is 60 to 65%. Left ventricular ejection fraction by PLAX is 63 %. The left ventricle has normal function. The left ventricle has no regional wall motion abnormalities. There is mild left ventricular hypertrophy. Left ventricular diastolic parameters are consistent with Grade I diastolic dysfunction (impaired relaxation). The average left ventricular global longitudinal strain is -25.3 %. The global longitudinal strain is normal.  2. Right ventricular systolic function is normal. The right  ventricular size is normal. There is normal pulmonary artery systolic pressure.  3. The mitral valve is grossly normal. Trivial mitral valve regurgitation.  4. The aortic valve is tricuspid. Aortic valve regurgitation is not visualized.  5. The inferior vena cava is normal in size with greater than 50% respiratory variability, suggesting right atrial pressure of 3 mmHg. Comparison(s): No prior Echocardiogram. FINDINGS  Left Ventricle: Left ventricular ejection fraction, by estimation, is 60 to 65%. Left ventricular ejection fraction by PLAX is 63 %. The left ventricle has normal function. The left ventricle has no regional wall motion abnormalities. The average left ventricular global longitudinal strain is -25.3 %. Strain was performed and the global longitudinal strain is normal. The left ventricular internal  cavity size was normal in size. There is mild left ventricular hypertrophy. Left ventricular diastolic parameters are consistent with Grade I diastolic dysfunction (impaired relaxation). Indeterminate filling pressures. Right Ventricle: The right ventricular size is normal. No increase in right ventricular wall thickness. Right ventricular systolic function is normal. There is normal pulmonary artery systolic pressure. The tricuspid regurgitant velocity is 2.08 m/s, and  with an assumed right atrial pressure of 3 mmHg, the estimated right ventricular systolic pressure is 20.3 mmHg. Left Atrium: Left atrial size was normal in size. Right Atrium: Right atrial size was normal in size. Pericardium: There is no evidence of pericardial effusion. Mitral Valve: The mitral valve is grossly normal. Trivial mitral valve regurgitation. Tricuspid Valve: The tricuspid valve is grossly normal. Tricuspid valve regurgitation is trivial. Aortic Valve: The aortic valve is tricuspid. Aortic valve regurgitation is not visualized. Pulmonic Valve: The pulmonic valve was normal in structure. Pulmonic valve regurgitation is not  visualized. Aorta: The aortic root and ascending aorta are structurally normal, with no evidence of dilitation. Venous: The inferior vena cava is normal in size with greater than 50% respiratory variability, suggesting right atrial pressure of 3 mmHg. IAS/Shunts: No atrial level shunt detected by color flow Doppler.  LEFT VENTRICLE PLAX 2D LV EF:         Left            Diastology                ventricular     LV e' medial:    8.16 cm/s                ejection        LV E/e' medial:  10.1                fraction by     LV e' lateral:   8.49 cm/s                PLAX is 63      LV E/e' lateral: 9.8                %. LVIDd:         5.00 cm         2D Longitudinal LVIDs:         3.30 cm         Strain LV PW:         1.00 cm         2D Strain GLS   -24.6 % LV IVS:        1.10 cm         (A4C): LVOT diam:     2.10 cm         2D Strain GLS   -21.7 % LV SV:         100             (A3C): LV SV Index:   52              2D Strain GLS   -29.6 % LVOT Area:     3.46 cm        (A2C):                                2D Strain GLS   -25.3 %  Avg: RIGHT VENTRICLE             IVC RV S prime:     17.10 cm/s  IVC diam: 1.20 cm TAPSE (M-mode): 2.7 cm LEFT ATRIUM           Index        RIGHT ATRIUM           Index LA diam:      3.20 cm 1.67 cm/m   RA Area:     13.70 cm LA Vol (A2C): 47.9 ml 24.92 ml/m  RA Volume:   31.40 ml  16.34 ml/m LA Vol (A4C): 44.4 ml 23.10 ml/m  AORTIC VALVE LVOT Vmax:   121.00 cm/s LVOT Vmean:  76.500 cm/s LVOT VTI:    0.289 m  AORTA Ao Root diam: 2.90 cm Ao Asc diam:  3.00 cm MITRAL VALVE                TRICUSPID VALVE MV Area (PHT): 3.85 cm     TR Peak grad:   17.3 mmHg MV Decel Time: 197 msec     TR Vmax:        208.00 cm/s MV E velocity: 82.80 cm/s MV A velocity: 106.00 cm/s  SHUNTS MV E/A ratio:  0.78         Systemic VTI:  0.29 m                             Systemic Diam: 2.10 cm Vinie Maxcy MD Electronically signed by Vinie Maxcy MD Signature Date/Time:  03/05/2024/11:14:34 AM    Final    CT ANGIO HEAD NECK W WO CM Result Date: 03/04/2024 EXAM: CTA HEAD AND NECK WITHOUT AND WITH 03/04/2024 05:59:58 PM TECHNIQUE: CTA of the head and neck was performed without and with the administration of 75 mL of iohexol (OMNIPAQUE) 350 MG/ML injection. Multiplanar 2D and/or 3D reformatted images are provided for review. Automated exposure control, iterative reconstruction, and/or weight based adjustment of the mA/kV was utilized to reduce the radiation dose to as low as reasonably achievable. Stenosis of the internal carotid arteries measured using NASCET criteria. COMPARISON: None available CLINICAL HISTORY: Transient ischemic attack (TIA). FINDINGS: CTA NECK: AORTIC ARCH AND ARCH VESSELS: No dissection or arterial injury. No significant stenosis of the brachiocephalic or subclavian arteries. CERVICAL CAROTID ARTERIES: Mesenteric mixed density atherosclerotic disease of the proximal left ICA without hemodynamically significant stenosis. The right ICA is patent without significant stenosis or dissection. No dissection or arterial injury. CERVICAL VERTEBRAL ARTERIES: No dissection, arterial injury, or significant stenosis. LUNGS AND MEDIASTINUM: Unremarkable. SOFT TISSUES: No acute abnormality. BONES: No acute abnormality. CTA HEAD: ANTERIOR CIRCULATION: No significant stenosis of the internal carotid arteries. No significant stenosis of the anterior cerebral arteries. No significant stenosis of the middle cerebral arteries. No aneurysm. POSTERIOR CIRCULATION: No significant stenosis of the posterior cerebral arteries. No significant stenosis of the basilar artery. No significant stenosis of the vertebral arteries. No aneurysm. OTHER: No dural venous sinus thrombosis on this non-dedicated study. IMPRESSION: 1. Atherosclerotic disease of the proximal left internal carotid artery without hemodynamically significant stenosis. 2. No large vessel occlusion, hemodynamically significant  stenosis, or aneurysm in the head or neck. Electronically signed by: Franky Stanford MD 03/04/2024 11:16 PM EDT RP Workstation: [id number]   MR BRAIN WO CONTRAST Result Date: 03/04/2024 CLINICAL DATA:  Provided history: Altered mental status, nontraumatic. EXAM: MRI HEAD WITHOUT CONTRAST TECHNIQUE: Multiplanar, multiecho pulse sequences of the brain  and surrounding structures were obtained without intravenous contrast. COMPARISON:  Head CT 11/19/2023. FINDINGS: Brain: No age-advanced or lobar predominant cerebral atrophy. Small acute/subacute infarct within the left corona radiata (series 3, images 30 and 31). Multifocal T2 FLAIR hyperintense signal abnormality elsewhere within the cerebral white matter, nonspecific but compatible with mild chronic small vessel ischemic disease. No cortical encephalomalacia is identified. No evidence of an intracranial mass. No chronic intracranial blood products. No extra-axial fluid collection. No midline shift. Vascular: Maintained flow voids within the proximal large arterial vessels. Skull and upper cervical spine: No focal worrisome marrow lesion. Sinuses/Orbits: No mass or acute finding within the imaged orbits. No significant paranasal sinus disease. Impression #1 will be called to the ordering clinician or representative by the Radiologist Assistant, and communication documented in the PACS or [employer]. IMPRESSION: 1. 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 2. Background mild cerebral white matter chronic small vessel ischemic disease. Electronically Signed   By: Rockey Childs D.O.   On: 03/04/2024 09:58   DG Chest Port 1 View Result Date: 03/02/2024 EXAM: 1 VIEW(S) XRAY OF THE CHEST 03/02/2024 10:04:32 PM COMPARISON: None available. CLINICAL HISTORY: CP. Intermittent chest pain x 11 days. Hx HTN. FINDINGS: LUNGS AND PLEURA: No focal pulmonary opacity. No pulmonary edema. No pleural effusion. No pneumothorax. HEART AND MEDIASTINUM: No acute  abnormality of the cardiac and mediastinal silhouettes. BONES AND SOFT TISSUES: Multilevel thoracic osteophytosis. IMPRESSION: 1. No acute cardiopulmonary pathology related to the clinical history of intermittent chest pain. Electronically signed by: Norman Gatlin MD 03/02/2024 10:13 PM EDT RP Workstation: [id number]   (Echo, Carotid, EGD, Colonoscopy, ERCP)    Subjective:  Patient is resting in bed she is awake and alert she has no new complaints she is able to move all her extremities no difficulty swallowing or speaking Discharge Exam: Vitals:   03/06/24 0825 03/06/24 0847  BP: 112/65   Pulse: (!) 55   Resp:  13  Temp: 98.1 F (36.7 C)   SpO2:     Vitals:   03/06/24 0354 03/06/24 0800 03/06/24 0825 03/06/24 0847  BP: (!) 100/59 112/65 112/65   Pulse:  (!) 55 (!) 55   Resp: 14   13  Temp: 97.9 F (36.6 C) 98.1 F (36.7 C) 98.1 F (36.7 C)   TempSrc: Oral  Oral   SpO2:      Weight:      Height:        General: Pt is alert, awake, not in acute distress Cardiovascular: RRR, S1/S2 +, no rubs, no gallops Respiratory: CTA bilaterally, no wheezing, no rhonchi Abdominal: Soft, NT, ND, bowel sounds + Extremities: no edema, no cyanosis    The results of significant diagnostics from this hospitalization (including imaging, microbiology, ancillary and laboratory) are listed below for reference.     Microbiology: No results found for this or any previous visit (from the past 240 hours).   Labs: BNP (last 3 results) No results for input(s): BNP in the last 8760 hours. Basic Metabolic Panel: Recent Labs  Lab 03/02/24 1839  NA 140  K 3.9  CL 103  CO2 25  GLUCOSE 92  BUN 16  CREATININE 0.85  CALCIUM 9.5   Liver Function Tests: No results for input(s): AST, ALT, ALKPHOS, BILITOT, PROT, ALBUMIN in the last 168 hours. No results for input(s): LIPASE, AMYLASE in the last 168 hours. No results for input(s): AMMONIA in the last 168  hours. CBC: Recent Labs  Lab 03/02/24 1839 03/04/24 0344  03/05/24 0332 03/06/24 0338  WBC 9.7 9.2 10.6* 9.6  HGB 10.6* 10.0* 10.1* 10.3*  HCT 33.4* 31.9* 32.2* 32.3*  MCV 74.9* 74.7* 74.5* 73.9*  PLT 384 347 337 312   Cardiac Enzymes: No results for input(s): CKTOTAL, CKMB, CKMBINDEX, TROPONINI in the last 168 hours. BNP: Invalid input(s): POCBNP CBG: No results for input(s): GLUCAP in the last 168 hours. D-Dimer No results for input(s): DDIMER in the last 72 hours. Hgb A1c Recent Labs    03/04/24 0344  HGBA1C 5.1   Lipid Profile Recent Labs    03/05/24 0332  CHOL 166  HDL 72  LDLCALC 82  TRIG 59  CHOLHDL 2.3   Thyroid  function studies No results for input(s): TSH, T4TOTAL, T3FREE, THYROIDAB in the last 72 hours.  Invalid input(s): FREET3 Anemia work up No results for input(s): VITAMINB12, FOLATE, FERRITIN, TIBC, IRON, RETICCTPCT in the last 72 hours. Urinalysis No results found for: COLORURINE, APPEARANCEUR, LABSPEC, PHURINE, GLUCOSEU, HGBUR, BILIRUBINUR, KETONESUR, PROTEINUR, UROBILINOGEN, NITRITE, LEUKOCYTESUR Sepsis Labs Recent Labs  Lab 03/02/24 1839 03/04/24 0344 03/05/24 0332 03/06/24 0338  WBC 9.7 9.2 10.6* 9.6   Microbiology No results found for this or any previous visit (from the past 240 hours).   Time coordinating discharge: 39 min SIGNED:   Almarie KANDICE Hoots, MD  [facility] 03/06/2024, 5:31 PM

## 2024-03-06 NOTE — Evaluation (Signed)
 Speech Language Pathology Evaluation Patient Details Name: Cristina Quinn MRN: [id number] DOB: Jul 11, 1955 Today's Date: 03/06/2024 Time: 8963-8944 SLP Time Calculation (min) (ACUTE ONLY): 19 min  Problem List:  Patient Active Problem List   Diagnosis Date Noted   Radial artery occlusion, right 03/02/2024   Past Medical History:  Past Medical History:  Diagnosis Date   Hypertension    Past Surgical History:  Past Surgical History:  Procedure Laterality Date   ABDOMINAL HYSTERECTOMY     HPI:  68 y.o. female presented 03/03/24 with R wrist pain and found to have radial artery occlusion for which Cards recommened IP admission for heparin. Pt mildly confused; MRI was obtained and revealed a small acute/subacute infarct within the left corona radiata. PMH significant of paroxysmal Afib (diagnosed but unable to afford and has not started anti-coagulant), and HTN   Assessment / Plan / Recommendation Clinical Impression  Pt appears to be at her cognitive and communicative baseline, reporting that acute symptoms have resolved. Her language is fluent and speech is clear. She scored 27/30 on the SLUMS, which is WNL, and she can describe hospital events with detail. Education offered about precautions upon return home with d/c anticipated for today. No further SLP f/u indicated.      SLP Assessment  SLP Recommendation/Assessment: Patient does not need any further Speech Language Pathology Services SLP Visit Diagnosis: Cognitive communication deficit (R41.841)     Assistance Recommended at Discharge  PRN  Functional Status Assessment Patient has not had a recent decline in their functional status  Frequency and Duration           SLP Evaluation Cognition  Overall Cognitive Status: Within Functional Limits for tasks assessed       Comprehension  Auditory Comprehension Overall Auditory Comprehension: Appears within functional limits for tasks assessed    Expression Expression Primary  Mode of Expression: Verbal Verbal Expression Overall Verbal Expression: Appears within functional limits for tasks assessed   Oral / Motor  Motor Speech Overall Motor Speech: Appears within functional limits for tasks assessed            Leita SAILOR., M.A. CCC-SLP Acute Rehabilitation Services Office: [phone]  Secure chat preferred  03/06/2024, 10:57 AM

## 2024-03-06 NOTE — Plan of Care (Signed)
 ?  Problem: Clinical Measurements: ?Goal: Ability to maintain clinical measurements within normal limits will improve ?Outcome: Progressing ?Goal: Will remain free from infection ?Outcome: Progressing ?Goal: Diagnostic test results will improve ?Outcome: Progressing ?  ?

## 2024-03-23 ENCOUNTER — Ambulatory Visit (INDEPENDENT_AMBULATORY_CARE_PROVIDER_SITE_OTHER): Admitting: Family

## 2024-03-23 ENCOUNTER — Encounter: Payer: Self-pay | Admitting: Family

## 2024-03-23 VITALS — BP 142/89 | HR 48 | Temp 97.3°F | Ht 65.0 in | Wt 200.6 lb

## 2024-03-23 DIAGNOSIS — Z1231 Encounter for screening mammogram for malignant neoplasm of breast: Secondary | ICD-10-CM

## 2024-03-23 DIAGNOSIS — K648 Other hemorrhoids: Secondary | ICD-10-CM | POA: Insufficient documentation

## 2024-03-23 DIAGNOSIS — R001 Bradycardia, unspecified: Secondary | ICD-10-CM | POA: Insufficient documentation

## 2024-03-23 DIAGNOSIS — I10 Essential (primary) hypertension: Secondary | ICD-10-CM | POA: Insufficient documentation

## 2024-03-23 DIAGNOSIS — E782 Mixed hyperlipidemia: Secondary | ICD-10-CM | POA: Diagnosis not present

## 2024-03-23 DIAGNOSIS — I480 Paroxysmal atrial fibrillation: Secondary | ICD-10-CM | POA: Insufficient documentation

## 2024-03-23 DIAGNOSIS — Z23 Encounter for immunization: Secondary | ICD-10-CM

## 2024-03-23 DIAGNOSIS — E669 Obesity, unspecified: Secondary | ICD-10-CM | POA: Insufficient documentation

## 2024-03-23 DIAGNOSIS — Z780 Asymptomatic menopausal state: Secondary | ICD-10-CM

## 2024-03-23 DIAGNOSIS — K581 Irritable bowel syndrome with constipation: Secondary | ICD-10-CM | POA: Insufficient documentation

## 2024-03-23 DIAGNOSIS — Z13820 Encounter for screening for osteoporosis: Secondary | ICD-10-CM

## 2024-03-23 NOTE — Patient Instructions (Addendum)
 Welcome to [facility], It was a pleasure meeting you today!    As discussed, I have sent referrals for your mammogram and Bone density scan. This has gone to 2 different locations so expect calls from the [facility] and the [facility].  Be sure and keep your [facility] Neurology appointment on 04/01/2024 and Cardiology on 06/19/2024.  You can schedule a physical with labs with me when convenient.    PLEASE NOTE: If you had any LAB tests please let us  know if you have not heard back within a few days. You may see your results on MyChart before we have a chance to review them but we will give you a call once they are reviewed by us . If we ordered any REFERRALS today, please let us  know if you have not heard from their office within the next week.  Let us  know through MyChart if you are needing REFILLS, or have your pharmacy send us  the request. You can also use MyChart to communicate with me or any office staff.  Please try these tips to maintain a healthy lifestyle: It is important that you exercise regularly at least 30 minutes 5 times a week. Think about what you will eat, plan ahead. Choose whole foods, & think  clean, green, fresh or frozen over canned, processed or packaged foods which are more sugary, salty, and fatty. 70 to 75% of food eaten should be fresh vegetables and protein. 2-3  meals daily with healthy snacks between meals, but must be whole fruit, protein or vegetables. Aim to eat over a 10 hour period when you are active, for example, 7am to 5pm, and then STOP after your last meal of the day, drinking only water.  Shorter eating windows, 6-8 hours, are showing benefits in heart disease and blood sugar regulation. Drink water every day! Shoot for 64 ounces daily = 8 cups, no other drink is as healthy! Fruit juice is best enjoyed in a healthy way, by EATING the fruit.

## 2024-03-23 NOTE — Progress Notes (Signed)
 New Patient Office Visit  Subjective:  Patient ID: Cristina Quinn, female    DOB: 13-Jul-1955  Age: 68 y.o. MRN: [id number]  CC:  Chief Complaint  Patient presents with   New Patient (Initial Visit)   Hemorrhoids    Pt c/o Hemorrhoids, present for 1 week. Has tried Prep-H suppositories, which does help relieve slightly. Slight rectal bleeding yesterday.   Discussed the use of AI scribe software for clinical note transcription with the patient, who gave verbal consent to proceed.  History of Present Illness Cristina Quinn is a 68 year old female with atrial fibrillation and a recent history of a mini stroke who presents for establishing care and follow-up on her medical conditions.  Atrial fibrillation was diagnosed several months ago, initially presenting with palpitations and dizziness. She is on apixaban and Cardizem, with previous medications metoprolol and hydrochlorothiazide  discontinued. She denies lightheadedness or unusual fatigue and is increasing her physical activity through [employer].  A mini stroke occurred a few weeks ago, identified during a hospital visit. She has a neurology follow-up scheduled at the end of the month.  She also has hemorrhoids and IBS w/constipation, experiencing constipation and occasional diarrhea. She uses Preparation H and prune juice for management. Occasional faint blood with bowel movements resolves quickly. She is considering increasing fiber intake.  She is due for a mammogram and bone density scan, with the last mammogram in October 2023. A colonoscopy was performed two years ago, and she is obtaining those records.   She is retired, married, and has been grieving over the past year, affecting her activity levels. She is working on improving her physical activity but she has joined the [employer] and looking forward to starting silver sneaker classes this week.  Assessment & Plan Paroxysmal atrial fibrillation Paroxysmal atrial fibrillation  managed with apixaban and diltiazem. Discussed stroke risk and importance of anticoagulation. - Continue apixaban 5mg  (Eliquis) twice daily. - Continue diltiazem 120mg  qd (Cardizem). - Follow up with cardiology in January.  Essential hypertension w/bradycardia Blood pressure slightly elevated, bradycardia likely due to diltiazem. No symptoms of lightheadedness or fatigue. Encouraged physical activity. - Monitor blood pressure and heart rate. - Encourage physical activity through [employer] program. - Follow up with cardiology in January.  History of transient ischemic attack (TIA) Recent TIA with emphasis on stroke risk, blood pressure, and cholesterol management. - Follow up with neurology at [facility] at the end of the month. - Continue atorvastatin (Lipitor) for cholesterol management. - Monitor blood pressure closely.  Mixed hyperlipidemia Cholesterol levels managed with atorvastatin 40mg  daily.  - Continue atorvastatin (Lipitor). - Last lipid panel checked beginning of this months wnl. - F/U with Cardiology or here in 1 year  Hemorrhoids Internal hemorrhoids with occasional bleeding and irritation. Constipation noted as a contributing factor. - Look for generic Preparation H cream for irritation, burning or itching on rectum 2-3x/day. - Continue Preparation H suppositories twice daily prn for rectal pain, pressure or bleeding. - F/U prn or with GI provider.  Irritable bowel syndrome with constipation predominance IBS with constipation predominance. Symptoms improved with dietary modifications. - Continue daily prune juice adding more dietary fiber as able and fluid intake, at least 2 liters daily. - Consider OTC Benefiber, or Citrucel if still needed for daily, soft bowel movements. - Consider OTC stool softener or generic Miralax daily if needed. - F/U prn or with GI provider.   General Health Maintenance Mammogram and bone density scan due. Flu vaccination  planned. Discussed importance  of regular screenings. - Order mammogram at the breast center. - Order bone density scan at the [facility]. - Administer flu shot today.  HPI Cristina Quinn presents for establishing care today.   Subjective:    Outpatient Medications Prior to Visit  Medication Sig Dispense Refill   acetaminophen (TYLENOL) 325 MG tablet Take 2 tablets (650 mg total) by mouth every 6 (six) hours as needed for moderate pain (pain score 4-6).     apixaban (ELIQUIS) 5 MG TABS tablet Take 1 tablet (5 mg total) by mouth 2 (two) times daily. 60 tablet 4   atorvastatin (LIPITOR) 40 MG tablet Take 1 tablet (40 mg total) by mouth daily. 30 tablet 4   diltiazem (CARDIZEM CD) 120 MG 24 hr capsule Take 1 capsule (120 mg total) by mouth daily. 30 capsule 4   No facility-administered medications prior to visit.   Past Medical History:  Diagnosis Date   Abnormal stress test 02/14/2024   Acute maxillary sinusitis, unspecified 07/17/2016   Anxiety 07/05/2021   Bilateral sensorineural hearing loss 09/19/2023   Caregiver stress 07/05/2021   Chronic pain of left knee 07/05/2021   Chronic right shoulder pain 07/05/2021   Cramps of lower extremity 12/17/2021   Dizziness 12/17/2021   Family history of osteoporosis in mother 07/05/2021   Flu 07/17/2016   Hair loss 07/05/2021   History of vitamin D deficiency 12/17/2021   Hypertension    Memory changes 07/05/2021   Obesity 03/23/2024   Seasonal allergies 07/05/2021   Sinusitis 06/19/2022   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   Constitutional:      Appearance: Normal appearance. She is obese.  Cardiovascular:     Rate and Rhythm: Normal rate and regular rhythm.  Pulmonary:     Effort: Pulmonary effort is normal.     Breath sounds: Normal breath sounds.  Musculoskeletal:        General: Normal range of motion.  Skin:    General: Skin is warm and dry.  Neurological:     Mental Status: She is alert.  Psychiatric:        Mood and Affect: Mood normal.        Behavior: Behavior normal.   Lucius Krabbe, NP

## 2024-03-24 ENCOUNTER — Telehealth: Payer: Self-pay

## 2024-03-24 ENCOUNTER — Other Ambulatory Visit: Payer: Self-pay

## 2024-03-24 DIAGNOSIS — I10 Essential (primary) hypertension: Secondary | ICD-10-CM

## 2024-03-24 MED ORDER — HYDROCHLOROTHIAZIDE 25 MG PO TABS: 25.0000 mg | ORAL_TABLET | Freq: Every morning | ORAL | 0 refills | Status: AC

## 2024-03-24 NOTE — Telephone Encounter (Signed)
 Copied from CRM #[id number]. Topic: Clinical - Medication Question >> Mar 23, 2024  2:36 PM Cristina Quinn wrote: Reason for CRM: patient is requesting to be placed on hydrochlorothiazide  (HYDRODIURIL ) tablet 25 mg because of the swelling and her pressure is higher than normal and needs a call back to confirm/verify why she needs to resume medication

## 2024-03-24 NOTE — Telephone Encounter (Signed)
 ok to resume the Hydrochlorothiazide  25mg  in the mornings. Does she have a bottle at home? ok to send refill, need to check her BMP in 2-3 weeks unless done at one of her upcoming f/u visits.

## 2024-03-24 NOTE — Telephone Encounter (Signed)
 I called pt, Rx has been sent. Appointment scheduled on November 5,2025.

## 2024-04-01 ENCOUNTER — Telehealth: Payer: Self-pay | Admitting: Neurology

## 2024-04-01 ENCOUNTER — Inpatient Hospital Stay: Admitting: Neurology

## 2024-04-01 NOTE — Telephone Encounter (Signed)
 Pt called to be connected to billing re: No Show fee for missed appointment today

## 2024-04-01 NOTE — Progress Notes (Deleted)
 Patient: Cristina Quinn Date of Birth: 1956-02-01  Reason for Visit: Follow up History from: Patient Primary Neurologist: Rosemarie    ASSESSMENT AND PLAN 68 y.o. year old female with left corona radiata small infarct.  Likely due to small vessel disease given location, cannot rule out cardioembolic source since recent diagnosis of A-fib not yet on anticoagulation.  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  Vascular risk factors: HTN, HLD, obesity.  Started on Eliquis.   HISTORY OF PRESENT ILLNESS: Today 04/01/24  HISTORY  Admitted 03/03/2024 for chest pain, right arm pain with right radial artery occlusion.  Had episode of confusion.  MRI showed left corona radiata small infarct.  Likely due to small vessel disease, cannot rule out cardioembolic given recent A-fib not yet on anticoagulation.  Right radial arm occlusion, right arm pain days after cardiac cath via right radial artery.  Follows with vascular.  No surgery indicated.  -MRI of the brain subacute small left corona radiata infarct - CTA head and neck unremarkable - 2D echo EF 60 to 65% - LDL 82, started Lipitor 40 - A1c 5.1 - No antithrombotic prior to admission, heparin IV and switch to Eliquis  REVIEW OF SYSTEMS: Out of a complete 14 system review of symptoms, the patient complains only of the following symptoms, and all other reviewed systems are negative.  See HPI  ALLERGIES: No Known Allergies  HOME MEDICATIONS: Outpatient Medications Prior to Visit  Medication Sig Dispense Refill   acetaminophen (TYLENOL) 325 MG tablet Take 2 tablets (650 mg total) by mouth every 6 (six) hours as needed for moderate pain (pain score 4-6).     apixaban (ELIQUIS) 5 MG TABS tablet Take 1 tablet (5 mg total) by mouth 2 (two) times daily. 60 tablet 4   atorvastatin (LIPITOR) 40 MG tablet Take 1 tablet (40 mg total) by mouth daily. 30 tablet 4   diltiazem (CARDIZEM CD) 120 MG 24 hr capsule Take 1 capsule (120 mg total) by mouth  daily. 30 capsule 4   hydrochlorothiazide  (HYDRODIURIL ) 25 MG tablet Take 1 tablet (25 mg total) by mouth in the morning. 90 tablet 0   No facility-administered medications prior to visit.    PAST MEDICAL HISTORY: Past Medical History:  Diagnosis Date   Abnormal stress test 02/14/2024   Acute maxillary sinusitis, unspecified 07/17/2016   Anxiety 07/05/2021   Bilateral sensorineural hearing loss 09/19/2023   Caregiver stress 07/05/2021   Chronic pain of left knee 07/05/2021   Chronic right shoulder pain 07/05/2021   Cramps of lower extremity 12/17/2021   Dizziness 12/17/2021   Family history of osteoporosis in mother 07/05/2021   Flu 07/17/2016   Hair loss 07/05/2021   History of vitamin D deficiency 12/17/2021   Hypertension    Memory changes 07/05/2021   Obesity 03/23/2024   Seasonal allergies 07/05/2021   Sinusitis 06/19/2022   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  SOCIAL HISTORY: Social History   Socioeconomic History   Marital status: Married    Spouse name: Not on file   Number of children: Not on file   Years of education: Not on file   Highest education level: Not on file  Occupational History   Not on file  Tobacco Use   Smoking status: Never   Smokeless tobacco: Never  Vaping Use  Vaping status: Never Used  Substance and Sexual Activity   Alcohol use: Yes    Comment: social   Drug use: Never   Sexual activity: Not on file  Other Topics Concern   Not on file  Social History Narrative   Not on file   Social Drivers of Health   Financial Resource Strain: Not on file  Food Insecurity: No Food Insecurity (03/03/2024)   Hunger Vital Sign    Worried About Running Out of Food in the Last Year: Never true    Ran Out of Food in the Last Year: Never true  Transportation Needs: No  Transportation Needs (03/03/2024)   PRAPARE - Administrator, Civil Service (Medical): No    Lack of Transportation (Non-Medical): No  Physical Activity: Not on file  Stress: Not on file  Social Connections: Moderately Integrated (03/03/2024)   Social Connection and Isolation Panel    Frequency of Communication with Friends and Family: Twice a week    Frequency of Social Gatherings with Friends and Family: Twice a week    Attends Religious Services: More than 4 times per year    Active Member of [employer] or Organizations: No    Attends Banker Meetings: Patient declined    Marital Status: Married  Catering Manager Violence: Not At Risk (03/03/2024)   Humiliation, Afraid, Rape, and Kick questionnaire    Fear of Current or Ex-Partner: No    Emotionally Abused: No    Physically Abused: No    Sexually Abused: No    PHYSICAL EXAM  There were no vitals filed for this visit. There is no height or weight on file to calculate BMI.  Generalized: Well developed, in no acute distress  Neurological examination  Mentation: Alert oriented to time, place, history taking. Follows all commands speech and language fluent Cranial nerve II-XII: Pupils were equal round reactive to light. Extraocular movements were full, visual field were full on confrontational test. Facial sensation and strength were normal. Uvula tongue midline. Head turning and shoulder shrug  were normal and symmetric. Motor: The motor testing reveals 5 over 5 strength of all 4 extremities. Good symmetric motor tone is noted throughout.  Sensory: Sensory testing is intact to soft touch on all 4 extremities. No evidence of extinction is noted.  Coordination: Cerebellar testing reveals good finger-nose-finger and heel-to-shin bilaterally.  Gait and station: Gait is normal. Tandem gait is normal. Romberg is negative. No drift is seen.  Reflexes: Deep tendon reflexes are symmetric and normal bilaterally.   DIAGNOSTIC  DATA (LABS, IMAGING, TESTING) - I reviewed patient records, labs, notes, testing and imaging myself where available.  Lab Results  Component Value Date   WBC 9.6 03/06/2024   HGB 10.3 (L) 03/06/2024   HCT 32.3 (L) 03/06/2024   MCV 73.9 (L) 03/06/2024   PLT 312 03/06/2024      Component Value Date/Time   NA 140 03/02/2024 1839   K 3.9 03/02/2024 1839   CL 103 03/02/2024 1839   CO2 25 03/02/2024 1839   GLUCOSE 92 03/02/2024 1839   BUN 16 03/02/2024 1839   CREATININE 0.85 03/02/2024 1839   CALCIUM 9.5 03/02/2024 1839   PROT 7.2 11/19/2023 1516   ALBUMIN 4.3 11/19/2023 1516   AST 20 11/19/2023 1516   ALT 14 11/19/2023 1516   ALKPHOS 39 11/19/2023 1516   BILITOT 0.6 11/19/2023 1516   GFRNONAA >60 03/02/2024 1839   GFRAA >60 10/26/2019 1648   Lab Results  Component Value  Date   CHOL 166 03/05/2024   HDL 72 03/05/2024   LDLCALC 82 03/05/2024   TRIG 59 03/05/2024   CHOLHDL 2.3 03/05/2024   Lab Results  Component Value Date   HGBA1C 5.1 03/04/2024   No results found for: [id number] Lab Results  Component Value Date   TSH 1.211 11/19/2023    Lauraine Born, AGNP-C, DNP 04/01/2024, 5:43 AM [facility] [address] [place], [place] [address] [phone]

## 2024-04-03 ENCOUNTER — Other Ambulatory Visit (HOSPITAL_COMMUNITY): Payer: Self-pay

## 2024-04-04 ENCOUNTER — Other Ambulatory Visit (HOSPITAL_COMMUNITY): Payer: Self-pay

## 2024-04-08 ENCOUNTER — Ambulatory Visit (INDEPENDENT_AMBULATORY_CARE_PROVIDER_SITE_OTHER): Admitting: Family

## 2024-04-08 ENCOUNTER — Encounter: Payer: Self-pay | Admitting: Family

## 2024-04-08 VITALS — BP 114/70 | HR 65 | Temp 97.5°F | Ht 65.0 in | Wt 193.1 lb

## 2024-04-08 DIAGNOSIS — E782 Mixed hyperlipidemia: Secondary | ICD-10-CM

## 2024-04-08 DIAGNOSIS — Z0000 Encounter for general adult medical examination without abnormal findings: Secondary | ICD-10-CM

## 2024-04-08 DIAGNOSIS — Z1159 Encounter for screening for other viral diseases: Secondary | ICD-10-CM

## 2024-04-08 MED ORDER — ATORVASTATIN CALCIUM 40 MG PO TABS: 20.0000 mg | ORAL_TABLET | Freq: Every day | ORAL | Status: AC

## 2024-04-08 NOTE — Progress Notes (Signed)
 Phone [phone]  Subjective:   Patient is a 68 y.o. female presenting for annual physical.    Chief Complaint  Patient presents with   Annual Exam    Fasting w/ labs  Discussed the use of AI scribe software for clinical note transcription with the patient, who gave verbal consent to proceed.  History of Present Illness Cristina Quinn is a 68 year old female who presents for a physical with labs & follow-up on medication management.  She has restarted hydrochlorothiazide  25mg  qam for her blood pressure. She was taken off of this in the hospital and started on Cardizem 120mg  to also help with new PAF but she was reporting high BP readings at home since our last visit and was advised to go ahead and restart the HCTZ. She denies having any LLE or ankle edema. Her blood pressure is stable, and her medications remain unchanged. She has a neurology appointment scheduled for tomorrow and upcoming Cardiology appointments. She is taking atorvastatin, which was increased to 40 mg daily during a recent hospital stay. She questions this increase as she was informed it was due to diabetes, which she denies having. Her A1c was 5.1 last month. She engages in regular exercise, including walking and participating in [employer], and occasionally uses a stationary bike. She is mindful of her diet, drinking prune juice to aid bowel movements and avoiding excessive sweets and white carbohydrates.  See problem oriented charting- ROS- full  review of systems was completed and negative except for what is noted in HPI above.  The following were reviewed and entered/updated in epic: Past Medical History:  Diagnosis Date   Abnormal stress test 02/14/2024   Acute maxillary sinusitis, unspecified 07/17/2016   Anxiety 07/05/2021   Bilateral sensorineural hearing loss 09/19/2023   Caregiver stress 07/05/2021   Chronic pain of left knee 07/05/2021   Chronic right shoulder pain 07/05/2021   Cramps of lower  extremity 12/17/2021   Dizziness 12/17/2021   Family history of osteoporosis in mother 07/05/2021   Flu 07/17/2016   Hair loss 07/05/2021   History of vitamin D deficiency 12/17/2021   Hypertension    Memory changes 07/05/2021   Obesity 03/23/2024   Seasonal allergies 07/05/2021   Sinusitis 06/19/2022   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  Medications- reviewed and updated Current Outpatient Medications  Medication Sig Dispense Refill   acetaminophen (TYLENOL) 325 MG tablet Take 2 tablets (650 mg total) by mouth every 6 (six) hours as needed for moderate pain (pain score 4-6).     apixaban (ELIQUIS) 5 MG TABS tablet Take 1 tablet (5 mg total) by mouth 2 (two) times daily. 60 tablet 4   diltiazem (CARDIZEM CD) 120 MG 24 hr capsule Take 1 capsule (120 mg total) by mouth daily. 30 capsule 4   hydrochlorothiazide  (HYDRODIURIL ) 25 MG tablet Take 1 tablet (25 mg total) by mouth in the morning. 90 tablet 0   atorvastatin (LIPITOR) 40 MG tablet Take 0.5 tablets (20 mg total) by mouth daily.     No current  facility-administered medications for this visit.   Allergies-reviewed and updated No Known Allergies  Social History   Social History Narrative   Not on file    Objective:  BP 114/70 (BP Location: Left Arm, Patient Position: Sitting, Cuff Size: Normal)   Pulse 65   Temp (!) 97.5 F (36.4 C) (Temporal)   Ht 5' 5 (1.651 m)   Wt 193 lb 2 oz (87.6 kg)   SpO2 98%   BMI 32.14 kg/m  Physical Exam Vitals and  nursing note reviewed.  Constitutional:      Appearance: Normal appearance.  HENT:     Head: Normocephalic.     Right Ear: Tympanic membrane normal.     Left Ear: Tympanic membrane normal.     Nose: Nose normal.     Mouth/Throat:     Mouth: Mucous membranes are moist.  Eyes:     Pupils: Pupils are equal, round, and reactive to light.  Cardiovascular:     Rate and Rhythm: Normal rate and regular rhythm.  Pulmonary:     Effort: Pulmonary effort is normal.     Breath sounds: Normal breath sounds.  Musculoskeletal:        General: Normal range of motion.     Cervical back: Normal range of motion.  Lymphadenopathy:     Cervical: No cervical adenopathy.  Skin:    General: Skin is warm and dry.  Neurological:     Mental Status: She is alert.  Psychiatric:        Mood and Affect: Mood normal.        Behavior: Behavior normal.     Assessment and Plan   Health Maintenance counseling: 1. Anticipatory guidance: Patient counseled regarding regular dental exams q6 months, eye exams,  avoiding smoking and second hand smoke, limiting alcohol to 1 beverage per day, no illicit drugs.   2. Risk factor reduction:  Advised patient of need for regular exercise and diet rich with fruits and vegetables to reduce risk of heart attack and stroke.  Wt Readings from Last 3 Encounters:  04/08/24 193 lb 2 oz (87.6 kg)  03/23/24 200 lb 9.6 oz (91 kg)  03/04/24 191 lb 12.8 oz (87 kg)   3. Immunizations/screenings/ancillary studies Immunization History  Administered Date(s) Administered   INFLUENZA, HIGH DOSE SEASONAL PF 05/21/2023, 03/23/2024   PFIZER(Purple Top)SARS-COV-2 Vaccination 08/09/2019, 09/08/2019, 05/05/2020   Pfizer Covid-19 Vaccine Bivalent Booster 59yrs & up 06/17/2021   Pfizer(Comirnaty)Fall Seasonal Vaccine 12 years and older 02/14/2023   Zoster Recombinant(Shingrix) 03/20/2017, 10/03/2017   Health Maintenance Due  Topic Date Due   Hepatitis C Screening  Never done   DEXA SCAN   Never done   Mammogram  03/12/2024    4. Cervical cancer screening- hx of hysterectomy. 5. Breast cancer screening-  mammogram ordered, last done 2023 6. Colon cancer screening - done 2024 - requesting records. 7. Skin cancer screening- advised regular sunscreen use. Denies worrisome, changing, or new skin lesions.  8. Birth control/STD check- N/A 9. Osteoporosis screening- Dexa scan ordered 10. Alcohol screening: none 11. Smoking associated screening (lung cancer screening, AAA screen 65-75, UA)- never smoked 12. [facility] [employer] and [facility] [employer]  Assessment & Plan Adult Wellness Visit Emphasized exercise, diet, and weight management. Discussed exercise's role in preventing dementia, Alzheimer's, and certain cancers. Due for mammogram & DEXA, orders sent but not called patient, provided phone number to call. - Continue exercise regimen with walking, [employer] classes, and stationary bike use. - Maintain  hydration with two liters of water daily. - Follow a diet low in white carbohydrates, focusing on fiber-rich foods and lean proteins. - Monitor weight and aim for weight loss, strategies provided.  Mixed hyperlipidemia Managed with atorvastatin. A1c was 5.1, no diabetes. LDL was 80, within normal limits, no hx of CV disease, recent dx of PAF.  - Advised ok to reduce atorvastatin dose to 20 mg daily for now, will recheck in 2-3 months, will defer to Cards judgment if higher dose needed.  - Will recheck lipid panel in a few months.  Hx of Internal hemorrhoids Intermittent symptoms with occasional bleeding and discomfort due to hard stools and straining. - Use OTC generic Preparation H suppositories once or twice daily for a week when having symptoms or blood in stool. - Call the office if toilet bowl is red due to more blood not just in stool, or if stool does not clear of blood after 1 week. - Apply OTC generic Preparation H cream for itching or burning. - Ensure soft  bowel movements to prevent straining, hydrating with at least 2 Liters water daily and eating high fiber diet.  Hypertension w/new PAF Blood pressure readings are within normal range. - Continue current antihypertensive medication regimen, including Cardizem 120mg  qd and HCTZ 25mg  qam, no refills needed today. - Check electrolytes due to restarting HCTZ. - F/U in 6 months.  Hysterectomy with residual tumors No current Pap smear needed due to hysterectomy. Requesting old medical records to review.  Recommended follow up:  Return in about 6 months (around 10/06/2024) for HTN, med refills, fasting labs. Future Appointments  Date Time Provider Department Center  04/09/2024  8:15 AM [place] Lauraine PARAS, NP [facility] None   Lab/Order associations:  fasting  Lucius Krabbe, NP

## 2024-04-08 NOTE — Patient Instructions (Addendum)
 It was very nice to see you today!   I will review your lab results via MyChart in a few days.  You look great! Stay well! Keep exercising and working on weight loss!  Ok to cut your Atorvastatin in half and take 1/2 pill daily = 20mg .  Start working on what we discussed today regarding your weight loss goals:  1) Eat small portions, ok to eat 6 mini meals vs. 3 big meals if this controls your hunger  better. 2) Eat until full and then STOP! This is your body telling you it has had enough. 3) Drink at least 64 oz water = 2 liters = 8, 8oz cups DAILY. 4) Eat most of your calories earlier in the day (if you work during the day).  Want to eat within a 8-10hour window if possible. No eating after 7pm, only no calorie drinks or water from 7p until bedtime. 5) Look for low carb, high protein foods/recipes. Avoid simple carbs including sweets, white bread, rice, potatoes, pasta. Look for whole grain/whole wheat/or almond flour if eating these foods. 6) High protein foods: meats (limit red meat), including turkey, chicken, pork, FISH (best source) nuts, cheese, beans (black beans, soy/edamame beans are best). Protein drinks, bars are also good but must have low sugar, look for < 10 grams. 7) Avoid processed/refined sugar and artificial sweeteners if possible. Stevia, Erythritol, or Monk fruit sweetener are preferred, natural, no calorie sweeteners.  Natural sweeteners like Agave or honey in very small amounts are ok also.       PLEASE NOTE:  If you had any lab tests please let us  know if you have not heard back within a few days. You may see your results on MyChart before we have a chance to review them but we will give you a call once they are reviewed by us . If we ordered any referrals today, please let us  know if you have not heard from their office within the next week.

## 2024-04-08 NOTE — Progress Notes (Unsigned)
 Patient: Cristina Quinn Date of Birth: [birth year]  Reason for Visit: Stroke Clinic Follow Up  History from: Patient Primary Neurologist: Rosemarie   ASSESSMENT AND PLAN 68 y.o. year old female with left corona radiata small infarct.  Likely due to small vessel disease given location, cannot rule out cardioembolic source since recent diagnosis of A-fib not yet on anticoagulation.  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  Vascular risk factors: HTN, HLD, obesity.  Started on Eliquis for AFIB. Denies any lingering deficits, other than numbness in her hands since initial event  -Continue follow up with cardiology, primary care. -Remains on Eliquis for AFIB, discussed importance of compliance  -Strict management of vascular risk factors with a goal BP less than 130/90, A1c less than 7.0, LDL less than 70 for secondary stroke prevention -Exercise, be active -Follow-up here as needed  HISTORY OF PRESENT ILLNESS: Today 04/09/24 Here today alone. Remains on Eliquis, also Lipitor. 1 weeks ago had palpations more frequent, felt some dizziness, continued with numbness in her hands and feet (started in the hospital), pain to the left arm. She called her cardiologist but wasn't able to report it. Compliant with Eliquis. In the last month, feeling better, not as depressed. Lives with her husband. Drives and is active. Mentions last year 2 losses of her mother and 1st cousin, tearful today. She actually lives in [place]  but has been here for the last year or so when she was caring for her mother with dementia who passed. Just joined [employer].  HISTORY  Admitted 03/03/2024 for chest pain, right arm pain with right radial artery occlusion.  Had episode of confusion.  MRI showed left corona radiata small infarct.  Likely due to small vessel disease, cannot rule out cardioembolic given recent A-fib not yet on anticoagulation.  Right radial arm occlusion, right arm pain days after cardiac cath via  right radial artery.  Follows with vascular.  No surgery indicated.  - MRI of the brain subacute small left corona radiata infarct - CTA head and neck unremarkable - 2D echo EF 60 to 65% - LDL 82, started Lipitor 40 - A1c 5.1 - No antithrombotic prior to admission, heparin IV and switch to Eliquis  REVIEW OF SYSTEMS: Out of a complete 14 system review of symptoms, the patient complains only of the following symptoms, and all other reviewed systems are negative.  See HPI  ALLERGIES: No Known Allergies  HOME MEDICATIONS: Outpatient Medications Prior to Visit  Medication Sig Dispense Refill   acetaminophen (TYLENOL) 325 MG tablet Take 2 tablets (650 mg total) by mouth every 6 (six) hours as needed for moderate pain (pain score 4-6).     apixaban (ELIQUIS) 5 MG TABS tablet Take 1 tablet (5 mg total) by mouth 2 (two) times daily. 60 tablet 4   atorvastatin (LIPITOR) 40 MG tablet Take 0.5 tablets (20 mg total) by mouth daily.     diltiazem (CARDIZEM CD) 120 MG 24 hr capsule Take 1 capsule (120 mg total) by mouth daily. 30 capsule 4   hydrochlorothiazide  (HYDRODIURIL ) 25 MG tablet Take 1 tablet (25 mg total) by mouth in the morning. 90 tablet 0   No facility-administered medications prior to visit.    PAST MEDICAL HISTORY: Past Medical History:  Diagnosis Date   Abnormal stress test 02/14/2024   Acute maxillary sinusitis, unspecified 07/17/2016   Anxiety 07/05/2021   Bilateral sensorineural hearing loss 09/19/2023   Caregiver stress 07/05/2021   Chronic pain of left knee 07/05/2021  Chronic right shoulder pain 07/05/2021   Cramps of lower extremity 12/17/2021   Dizziness 12/17/2021   Family history of osteoporosis in mother 07/05/2021   Flu 07/17/2016   Hair loss 07/05/2021   History of vitamin D deficiency 12/17/2021   Hypertension    Memory changes 07/05/2021   Obesity 03/23/2024   Seasonal allergies 07/05/2021   Sinusitis 06/19/2022   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 No pertinent family history.  SOCIAL HISTORY: Social History   Socioeconomic History   Marital status: Married    Spouse name: Not on file   Number of children: Not on file   Years of education: Not on file   Highest education level: Not on file  Occupational History   Not on file  Tobacco Use   Smoking status: Never   Smokeless tobacco: Never  Vaping Use   Vaping status: Never Used  Substance and Sexual Activity   Alcohol use: Yes    Comment: social   Drug use: Never   Sexual activity: Not on file  Other Topics Concern   Not on file  Social History Narrative   Not on file   Social Drivers of Health   Financial Resource Strain: Not on file  Food Insecurity: No Food Insecurity (03/03/2024)   Hunger Vital Sign    Worried About Running Out of Food in the Last Year: Never true    Ran Out of Food in the Last Year: Never true  Transportation Needs: No Transportation Needs (03/03/2024)   PRAPARE - Administrator, Civil Service (Medical): No    Lack of Transportation (Non-Medical): No  Physical Activity: Not on file  Stress: Not on file  Social Connections: Moderately Integrated (03/03/2024)   Social Connection and Isolation Panel    Frequency of Communication with Friends and Family: Twice a week    Frequency of Social Gatherings with Friends and Family: Twice a week    Attends Religious Services: More than 4 times per year    Active Member of [employer] or Organizations: No    Attends Banker Meetings: Patient declined    Marital Status: Married  Catering Manager Violence: Not At Risk (03/03/2024)   Humiliation, Afraid, Rape, and Kick questionnaire    Fear of Current or Ex-Partner: No    Emotionally Abused: No    Physically Abused: No    Sexually Abused: No    PHYSICAL EXAM  Vitals:    04/09/24 0754  BP: 118/73  Pulse: 62  SpO2: 100%  Weight: 196 lb (88.9 kg)  Height: 5' 4 (1.626 m)   Body mass index is 33.64 kg/m.  Generalized: Well developed, in no acute distress  Neurological examination  Mentation: Alert oriented to time, place, history taking. Follows all commands speech and language fluent Cranial nerve II-XII: Pupils were equal round reactive to light. Extraocular movements were full, visual field were full on confrontational test. Facial sensation and strength were normal. Head turning and shoulder shrug  were normal and symmetric. Motor: The motor testing reveals 5 over 5 strength of all 4 extremities. Good symmetric motor tone is noted throughout.  Sensory: Sensory testing is intact to soft touch on all 4 extremities. No evidence of extinction is noted.  Coordination: Cerebellar testing reveals good finger-nose-finger and heel-to-shin bilaterally.  Gait and station: Gait is normal  in pointed toe heel boots Reflexes: Deep tendon reflexes are symmetric and normal bilaterally.   DIAGNOSTIC DATA (LABS, IMAGING, TESTING) - I reviewed patient records, labs, notes, testing and imaging myself where available.  Lab Results  Component Value Date   WBC 9.6 03/06/2024   HGB 10.3 (L) 03/06/2024   HCT 32.3 (L) 03/06/2024   MCV 73.9 (L) 03/06/2024   PLT 312 03/06/2024      Component Value Date/Time   NA 140 03/02/2024 1839   K 3.9 03/02/2024 1839   CL 103 03/02/2024 1839   CO2 25 03/02/2024 1839   GLUCOSE 92 03/02/2024 1839   BUN 16 03/02/2024 1839   CREATININE 0.85 03/02/2024 1839   CALCIUM 9.5 03/02/2024 1839   PROT 7.2 11/19/2023 1516   ALBUMIN 4.3 11/19/2023 1516   AST 20 11/19/2023 1516   ALT 14 11/19/2023 1516   ALKPHOS 39 11/19/2023 1516   BILITOT 0.6 11/19/2023 1516   GFRNONAA >60 03/02/2024 1839   GFRAA >60 10/26/2019 1648   Lab Results  Component Value Date   CHOL 166 03/05/2024   HDL 72 03/05/2024   LDLCALC 82 03/05/2024   TRIG 59  03/05/2024   CHOLHDL 2.3 03/05/2024   Lab Results  Component Value Date   HGBA1C 5.1 03/04/2024   No results found for: [id number] Lab Results  Component Value Date   TSH 1.211 11/19/2023    Lauraine Born, AGNP-C, DNP 04/09/2024, 8:28 AM [facility] [address] [place], [place] [address] [phone]

## 2024-04-09 ENCOUNTER — Encounter: Payer: Self-pay | Admitting: Neurology

## 2024-04-09 ENCOUNTER — Ambulatory Visit: Admitting: Neurology

## 2024-04-09 VITALS — BP 118/73 | HR 62 | Ht 64.0 in | Wt 196.0 lb

## 2024-04-09 DIAGNOSIS — I639 Cerebral infarction, unspecified: Secondary | ICD-10-CM

## 2024-04-09 DIAGNOSIS — I70208 Unspecified atherosclerosis of native arteries of extremities, other extremity: Secondary | ICD-10-CM | POA: Diagnosis not present

## 2024-04-09 DIAGNOSIS — I480 Paroxysmal atrial fibrillation: Secondary | ICD-10-CM

## 2024-04-09 LAB — HEPATITIS C ANTIBODY: Hepatitis C Ab: NONREACTIVE

## 2024-04-09 LAB — TSH: TSH: 0.36 u[IU]/mL (ref 0.35–5.50)

## 2024-04-09 LAB — COMPREHENSIVE METABOLIC PANEL WITH GFR
ALT: 9 U/L (ref 0–35)
AST: 15 U/L (ref 0–37)
Albumin: 4.5 g/dL (ref 3.5–5.2)
Alkaline Phosphatase: 46 U/L (ref 39–117)
BUN: 19 mg/dL (ref 6–23)
CO2: 29 meq/L (ref 19–32)
Calcium: 9.9 mg/dL (ref 8.4–10.5)
Chloride: 102 meq/L (ref 96–112)
Creatinine, Ser: 0.82 mg/dL (ref 0.40–1.20)
GFR: 73.58 mL/min (ref >60.00)
Glucose, Bld: 90 mg/dL (ref 70–99)
Potassium: 3.9 meq/L (ref 3.5–5.1)
Sodium: 139 meq/L (ref 135–145)
Total Bilirubin: 0.8 mg/dL (ref 0.2–1.2)
Total Protein: 7.3 g/dL (ref 6.0–8.3)

## 2024-04-09 NOTE — Patient Instructions (Signed)
 Great to meet you today! Continue Eliquis as prescribed by cardiology Follow-up with cardiology and primary care Strict management of vascular risk factors with a goal BP less than 130/90, A1c less than 7.0, LDL less than 70 for secondary stroke prevention Return here as needed.  Thanks

## 2024-04-10 ENCOUNTER — Ambulatory Visit: Payer: Self-pay | Admitting: Family

## 2024-04-11 NOTE — Progress Notes (Signed)
 I agree with the above plan

## 2024-06-10 ENCOUNTER — Ambulatory Visit

## 2024-06-29 ENCOUNTER — Ambulatory Visit

## 2024-06-30 ENCOUNTER — Ambulatory Visit: Payer: Self-pay

## 2024-06-30 NOTE — Telephone Encounter (Signed)
 FYI Only or Action Required?: FYI only for provider: appointment scheduled on Thursday afternoon.  Patient was last seen in primary care on 04/08/2024 by Cristina Krabbe, NP.  Called Nurse Triage reporting Generalized Body Aches. And numbness  Symptoms began about a month ago.  Interventions attempted: Nothing.  Symptoms are: gradually worsening.  Triage Disposition: See PCP When Office is Open (Within 3 Days)  Patient/caregiver understands and will follow disposition?: Yes                           Reason for Triage: Problems with inflammation all over, headaches but may not be related, feeling stiff/numbness from hips, legs, feet, arms both sides- everything below neck   Reason for Disposition  [1] MILD or MODERATE muscle aches or pain AND [2] taking a statin medicine (a lipid or cholesterol lowering drug)  Answer Assessment - Initial Assessment Questions 1. ONSET: When did the muscle aches or body pains start?      1 month ago 2. LOCATION: What part of your body is hurting? (e.g., entire body, arms, legs)      Feet to thighs and arms and neck 3. SEVERITY: How bad is the pain? (Scale 1-10; or mild, moderate, severe)     No pain 4. CAUSE: What do you think is causing the pains?     inflammation 5. FEVER: Do you have a fever? If Yes, ask: What is your temperature, how was it measured, and  when did it start?      no 6. OTHER SYMPTOMS: Do you have any other symptoms? (e.g., chest pain, cold or flu symptoms, rash, weakness, weight loss)     Numbness in feet and hands  Protocols used: Muscle Aches and Body Pain-A-AH

## 2024-06-30 NOTE — Telephone Encounter (Signed)
 Patient has appointment 07/02/24

## 2024-07-02 ENCOUNTER — Ambulatory Visit: Admitting: Family

## 2024-07-02 ENCOUNTER — Encounter: Payer: Self-pay | Admitting: Family

## 2024-07-02 VITALS — BP 134/83 | HR 54 | Temp 97.3°F | Ht 64.0 in | Wt 196.5 lb

## 2024-07-02 DIAGNOSIS — M2550 Pain in unspecified joint: Secondary | ICD-10-CM | POA: Insufficient documentation

## 2024-07-02 DIAGNOSIS — J0110 Acute frontal sinusitis, unspecified: Secondary | ICD-10-CM

## 2024-07-02 DIAGNOSIS — G609 Hereditary and idiopathic neuropathy, unspecified: Secondary | ICD-10-CM | POA: Insufficient documentation

## 2024-07-02 MED ORDER — TRIAMCINOLONE ACETONIDE 55 MCG/ACT NA AERO: 1.0000 | INHALATION_SPRAY | Freq: Every day | NASAL | 2 refills | Status: AC

## 2024-07-02 MED ORDER — DICLOFENAC SODIUM 1 % EX GEL: 4.0000 g | Freq: Four times a day (QID) | CUTANEOUS | 3 refills | Status: AC | PRN

## 2024-07-02 NOTE — Patient Instructions (Addendum)
 It was very nice to see you today!   For your feet tingling & numbness be sure your shoes fit well and not too tight anywhere.  Look for cushioning insoles (Dr Farris for example) to replace your shoe insole to help prevent pain and tingling. Let me know if your foot tingling persists and I can refer you to a podiatrist.  For your hand stiffness/pain and headaches you can take generic Tylenol  arthritis strength, per directions on the bottle.  For the hand tingling you can look for wrist braces at [facility] or the pharmacy (where you see ace bandages) and wear these at overnight while sleeping for 2 weeks, and during the day as able.   For your sinus symptoms I have sent over a nasal spray to use - start with 1 squirt each nostril twice a day for 3 days, then reduce to 1 time daily until all sinus symptoms are better.          PLEASE NOTE:  If you had any lab tests please let us  know if you have not heard back within a few days. You may see your results on MyChart before we have a chance to review them but we will give you a call once they are reviewed by us . If we ordered any referrals today, please let us  know if you have not heard from their office within the next week.

## 2024-07-02 NOTE — Progress Notes (Signed)
 "  Patient ID: Cristina Quinn, female    DOB: March 25, 1956, 69 y.o.   MRN: [id number]  Chief Complaint  Patient presents with   Extremity Weakness    Pt c/o numbness/tingling in bilateral legs and weakness. Sx Present for 1 month. Tingling in left arm off and on last night.    Headache    Pt c/o dull headaches, present off and on for 1 month.   Discussed the use of AI scribe software for clinical note transcription with the patient, who gave verbal consent to proceed.  History of Present Illness [name] is a 69 year old female with a history of stroke who presents with numbness, tingling, and stiffness in her extremities.  She has had numbness and tingling in her hands and feet for about a month, with cramping in her legs and lower legs over the past couple of weeks. Symptoms sometimes extend into her arms and shoulders on worse days. She also notes continuous stiffness in her feet and hands, worse on waking, with tingling and numbness in her fingertips and feet and some swelling in her right ankle and possibly her left. About a month ago she was seen in urgent care for right ear stuffiness, throat discomfort, and tooth sensation and was treated with amoxicillin, which improved symptoms. These have recently recurred, especially since yesterday. She has stiffness in her arms and shoulders that was notably worse last night. She has no fever or chills. She has dull, intermittent headaches that she attributes to sinus issues. Tylenol  provides partial relief only. Her current medications are Eliquis  twice daily, Lipitor, Cardizem  120 mg, and hydrochlorothiazide  in the morning.  Assessment & Plan Idiopathic peripheral neuropathy Intermittent tingling, numbness, stiffness, and cramping in extremities. Possible age-related nerve changes. Differential includes carpal tunnel syndrome and arthritis-related stiffness. She reports hx of using wrist braces for carpal tunnel sx in past but had not tried to  use this time. - Ensure comfortable, non-tight shoes. - Consider podiatrist referral if symptoms worsen. - Use cushioned insoles to replace current shoe insoles. - Use wrist braces (has at home) while sleeping and during day as able for 2 weeks to reduce neuropathy sx.  - Apply diclofenac  gel to hands and feet. - Consider Tylenol  arthritis for pain. - Keep extremities warm. - Call back if sx are persisting.  Polyarthralgia Morning joint stiffness and pain, exacerbated by cold, consistent with arthritis. No continuous leg pain. - Keep extremities warm. - Consider electric hand warmers or warm socks and gloves. - Use Tylenol  arthritis for pain and stiffness. - Apply diclofenac  gel to hands and feet as needed for pain. - Call back if persistent symptoms.  Chronic sinusitis with eustachian tube dysfunction Intermittent ear fullness and nasal congestion, frontal headaches/pressure and runny nose, primarily right-sided ear congestion. Recent sinusitis treated with amoxicillin. Possible fluid retention in ear. - Prescribed Nasacort  nasal spray, one squirt in each nostril twice daily for 3 days, then use daily until sx are improved. - Ensure hydration with 2.5 to 3 liters of fluid daily, caffeine-free. - Monitor symptoms and adjust treatment as needed.  Subjective:    Outpatient Medications Prior to Visit  Medication Sig Dispense Refill   acetaminophen  (TYLENOL ) 325 MG tablet Take 2 tablets (650 mg total) by mouth every 6 (six) hours as needed for moderate pain (pain score 4-6).     apixaban  (ELIQUIS ) 5 MG TABS tablet Take 1 tablet (5 mg total) by mouth 2 (two) times daily. 60 tablet 4   atorvastatin  (  LIPITOR) 40 MG tablet Take 0.5 tablets (20 mg total) by mouth daily.     diltiazem  (CARDIZEM  CD) 120 MG 24 hr capsule Take 1 capsule (120 mg total) by mouth daily. 30 capsule 4   hydrochlorothiazide  (HYDRODIURIL ) 25 MG tablet Take 1 tablet (25 mg total) by mouth in the morning. 90 tablet 0    No facility-administered medications prior to visit.   Past Medical History:  Diagnosis Date   Abnormal stress test 02/14/2024   Acute maxillary sinusitis, unspecified 07/17/2016   Anxiety 07/05/2021   Bilateral sensorineural hearing loss 09/19/2023   Caregiver stress 07/05/2021   Chronic pain of left knee 07/05/2021   Chronic right shoulder pain 07/05/2021   Cramps of lower extremity 12/17/2021   Dizziness 12/17/2021   Family history of osteoporosis in mother 07/05/2021   Flu 07/17/2016   Hair loss 07/05/2021   History of vitamin D deficiency 12/17/2021   Hypertension    Memory changes 07/05/2021   Obesity 03/23/2024   Seasonal allergies 07/05/2021   Sinusitis 06/19/2022   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  Constitutional:      Appearance: Normal appearance. She is not ill-appearing.     Interventions: Face mask in place.  HENT:     Right Ear: Tympanic membrane and ear canal normal.     Left Ear: Tympanic membrane and ear canal normal.     Nose:     Right Sinus: No frontal sinus tenderness (pressure).     Left Sinus: No frontal sinus tenderness (pressure).     Mouth/Throat:     Mouth: Mucous membranes are moist.     Pharynx: Postnasal drip present. No pharyngeal swelling, oropharyngeal exudate, posterior oropharyngeal erythema or uvula swelling.     Tonsils: No tonsillar exudate or tonsillar abscesses.  Cardiovascular:     Rate and Rhythm: Normal rate and regular rhythm.  Pulmonary:     Effort: Pulmonary effort is normal.     Breath sounds: Normal breath sounds.  Musculoskeletal:        General: Normal range of motion.  Lymphadenopathy:     Head:     Right side of head: No preauricular or posterior auricular adenopathy.     Left side of head: No preauricular or posterior auricular  adenopathy.     Cervical: No cervical adenopathy.  Skin:    General: Skin is warm and dry.  Neurological:     Mental Status: She is alert.  Psychiatric:        Mood and Affect: Mood normal.        Behavior: Behavior normal.    BP 134/83 (BP Location: Left Arm, Patient Position: Sitting, Cuff Size: Large)   Pulse (!) 54   Temp (!) 97.3 F (36.3 C) (Temporal)   Ht 5' 4 (1.626 m)   Wt 196 lb 8 oz (89.1 kg)   SpO2 99%   BMI 33.73 kg/m  Wt Readings from Last 3 Encounters:  07/02/24 196 lb 8 oz (89.1 kg)  04/09/24 196 lb (88.9 kg)  04/08/24 193 lb 2 oz (87.6 kg)      Anmarie Fukushima, NP     [1] No Known Allergies  "

## 2024-07-03 ENCOUNTER — Other Ambulatory Visit (HOSPITAL_COMMUNITY): Payer: Self-pay

## 2024-07-07 ENCOUNTER — Telehealth: Payer: Self-pay

## 2024-07-07 ENCOUNTER — Other Ambulatory Visit (HOSPITAL_COMMUNITY): Payer: Self-pay

## 2024-07-08 NOTE — Telephone Encounter (Signed)
 Let Cristina Quinn know her insurance is not covering the Nasacort  nasal spray - but she can buy it off of the shelf- ask pharmacist to find the [facility] for her - also she can use a Good[employer] card or other discount card to save money. Thx

## 2024-07-15 ENCOUNTER — Ambulatory Visit

## 2024-10-07 ENCOUNTER — Ambulatory Visit: Admitting: Family
